# Patient Record
Sex: Male | Born: 1945
Health system: Southern US, Community
[De-identification: ages and names within clinical notes are randomized; demographics above are authoritative.]

## PROBLEM LIST (undated history)

## (undated) DIAGNOSIS — F329 Major depressive disorder, single episode, unspecified: Secondary | ICD-10-CM

## (undated) DIAGNOSIS — I348 Other nonrheumatic mitral valve disorders: Secondary | ICD-10-CM

## (undated) DIAGNOSIS — I4891 Unspecified atrial fibrillation: Secondary | ICD-10-CM

## (undated) DIAGNOSIS — E7800 Pure hypercholesterolemia, unspecified: Secondary | ICD-10-CM

## (undated) DIAGNOSIS — I340 Nonrheumatic mitral (valve) insufficiency: Secondary | ICD-10-CM

## (undated) DIAGNOSIS — I3489 Other nonrheumatic mitral valve disorders: Secondary | ICD-10-CM

## (undated) DIAGNOSIS — Z720 Tobacco use: Secondary | ICD-10-CM

## (undated) DIAGNOSIS — F32A Depression, unspecified: Secondary | ICD-10-CM

## (undated) HISTORY — DX: Tobacco use: Z72.0

## (undated) HISTORY — DX: Pure hypercholesterolemia, unspecified: E78.00

## (undated) HISTORY — DX: Other nonrheumatic mitral valve disorders: I34.8

## (undated) HISTORY — PX: TONSILLECTOMY: SUR1361

## (undated) HISTORY — DX: Nonrheumatic mitral (valve) insufficiency: I34.0

## (undated) HISTORY — DX: Major depressive disorder, single episode, unspecified: F32.9

## (undated) HISTORY — DX: Unspecified atrial fibrillation: I48.91

## (undated) HISTORY — PX: SHOULDER SURGERY: SHX246

## (undated) HISTORY — DX: Depression, unspecified: F32.A

## (undated) HISTORY — DX: Other nonrheumatic mitral valve disorders: I34.89

---

## 2010-03-12 ENCOUNTER — Encounter: Payer: Self-pay | Admitting: Family Medicine

## 2010-03-12 DIAGNOSIS — R002 Palpitations: Secondary | ICD-10-CM

## 2010-03-14 ENCOUNTER — Other Ambulatory Visit: Payer: Self-pay

## 2010-03-14 ENCOUNTER — Ambulatory Visit (HOSPITAL_COMMUNITY)
Admission: RE | Admit: 2010-03-14 | Discharge: 2010-03-14 | Disposition: A | Discharge status: Home / Self Care | Payer: Self-pay | Source: Ambulatory Visit | Attending: Physician Assistant | Admitting: Physician Assistant

## 2010-03-14 ENCOUNTER — Other Ambulatory Visit: Payer: Self-pay | Admitting: Family Medicine

## 2010-03-14 ENCOUNTER — Encounter: Payer: Self-pay | Admitting: Family Medicine

## 2010-03-14 DIAGNOSIS — I4821 Permanent atrial fibrillation: Secondary | ICD-10-CM | POA: Insufficient documentation

## 2010-03-14 DIAGNOSIS — R002 Palpitations: Secondary | ICD-10-CM

## 2010-03-14 DIAGNOSIS — I4510 Unspecified right bundle-branch block: Secondary | ICD-10-CM | POA: Insufficient documentation

## 2010-03-14 DIAGNOSIS — I498 Other specified cardiac arrhythmias: Secondary | ICD-10-CM | POA: Insufficient documentation

## 2010-03-14 DIAGNOSIS — I4891 Unspecified atrial fibrillation: Secondary | ICD-10-CM | POA: Insufficient documentation

## 2010-03-14 DIAGNOSIS — I480 Paroxysmal atrial fibrillation: Secondary | ICD-10-CM | POA: Insufficient documentation

## 2010-03-14 DIAGNOSIS — N429 Disorder of prostate, unspecified: Secondary | ICD-10-CM

## 2010-03-14 LAB — COMPREHENSIVE METABOLIC PANEL
AST: 16 U/L (ref 0–37)
Albumin: 4.5 g/dL (ref 3.5–5.2)
Alkaline Phosphatase: 57 U/L (ref 39–117)
BUN: 16 mg/dL (ref 6–23)
Creat: 0.91 mg/dL (ref 0.40–1.50)
Glucose, Bld: 85 mg/dL (ref 70–99)
Potassium: 4.3 mEq/L (ref 3.5–5.3)
Total Bilirubin: 0.7 mg/dL (ref 0.3–1.2)

## 2010-03-14 LAB — CONVERTED CEMR LAB
Albumin: 4.5 g/dL (ref 3.5–5.2)
BUN: 16 mg/dL (ref 6–23)
CO2: 31 meq/L (ref 19–32)
Glucose, Bld: 85 mg/dL (ref 70–99)
Hemoglobin: 16 g/dL (ref 13.0–17.0)
MCV: 95.2 fL (ref 78.0–100.0)
RBC: 4.84 M/uL (ref 4.22–5.81)
Sodium: 141 meq/L (ref 135–145)
TSH: 4.555 microintl units/mL — ABNORMAL HIGH (ref 0.350–4.500)
Total Bilirubin: 0.7 mg/dL (ref 0.3–1.2)
Total Protein: 6.7 g/dL (ref 6.0–8.3)
WBC: 6.6 10*3/uL (ref 4.0–10.5)

## 2010-03-14 NOTE — Assessment & Plan Note (Signed)
Possible atrial fibrilation and has heart murmur.  Will start on beta blocker and aspirin.  Return to clinic for TSH, EKG and echocardiogram.  Finances are an issue so he will apply for aid through [facility]

## 2010-03-14 NOTE — Progress Notes (Signed)
Dr. Leveda Anna notified of BP reading today.

## 2010-03-14 NOTE — Progress Notes (Signed)
  Subjective:    Patient ID: Kenneth Mclaughlin, male    DOB: [birth year], 65 y.o.   MRN: [id number]  HPI Seen after hours in [facility].  Patient felt palpatations.  Son, who is MD in residency, felt rapid irregular heart beat and suspected atrial fibrilation.  Has a history of intermitant palpitations for years.    Review of Systems No syncope, DOE, stroke or chest pain.  No history of CHF, thyroid problems, or CAD     Objective:   Physical Exam Heart RRR, 2/6 systolic murmur Lungs clear Neck no thyromegally       Assessment & Plan:

## 2010-03-15 LAB — CBC
HCT: 46.1 % (ref 39.0–52.0)
Hemoglobin: 16 g/dL (ref 13.0–17.0)
MCH: 33.1 pg (ref 26.0–34.0)
MCHC: 34.7 g/dL (ref 30.0–36.0)
MCV: 95.2 fL (ref 78.0–100.0)
RDW: 13.3 % (ref 11.5–15.5)

## 2010-03-28 ENCOUNTER — Telehealth: Payer: Self-pay | Admitting: Family Medicine

## 2010-03-28 DIAGNOSIS — R002 Palpitations: Secondary | ICD-10-CM

## 2010-03-28 NOTE — Telephone Encounter (Signed)
Called and informed pt of echo appt. Pt agreed.Loralee Pacas Hepburn

## 2010-03-28 NOTE — Telephone Encounter (Signed)
Mr. Salce has been approved to receive the [facility] card.  Have yet to actually get one, but she has certified his eligibility.  Please go forth with referral to Cardiologist and call when appt has been made.

## 2010-04-05 ENCOUNTER — Other Ambulatory Visit: Payer: Self-pay | Admitting: Family Medicine

## 2010-04-05 ENCOUNTER — Ambulatory Visit (HOSPITAL_COMMUNITY)
Admission: RE | Admit: 2010-04-05 | Discharge: 2010-04-05 | Disposition: A | Discharge status: Home / Self Care | Payer: Self-pay | Source: Ambulatory Visit | Attending: Family Medicine | Admitting: Family Medicine

## 2010-04-05 DIAGNOSIS — I079 Rheumatic tricuspid valve disease, unspecified: Secondary | ICD-10-CM | POA: Insufficient documentation

## 2010-04-05 DIAGNOSIS — Z87891 Personal history of nicotine dependence: Secondary | ICD-10-CM | POA: Insufficient documentation

## 2010-04-05 DIAGNOSIS — I519 Heart disease, unspecified: Secondary | ICD-10-CM | POA: Insufficient documentation

## 2010-04-05 DIAGNOSIS — I059 Rheumatic mitral valve disease, unspecified: Secondary | ICD-10-CM | POA: Insufficient documentation

## 2010-04-06 ENCOUNTER — Encounter: Payer: Self-pay | Admitting: Family Medicine

## 2010-04-08 ENCOUNTER — Ambulatory Visit (INDEPENDENT_AMBULATORY_CARE_PROVIDER_SITE_OTHER): Payer: Self-pay | Admitting: Family Medicine

## 2010-04-08 ENCOUNTER — Encounter: Payer: Self-pay | Admitting: Family Medicine

## 2010-04-08 DIAGNOSIS — Z2989 Encounter for other specified prophylactic measures: Secondary | ICD-10-CM | POA: Insufficient documentation

## 2010-04-08 DIAGNOSIS — I059 Rheumatic mitral valve disease, unspecified: Secondary | ICD-10-CM

## 2010-04-08 DIAGNOSIS — I38 Endocarditis, valve unspecified: Secondary | ICD-10-CM

## 2010-04-08 DIAGNOSIS — I340 Nonrheumatic mitral (valve) insufficiency: Secondary | ICD-10-CM | POA: Insufficient documentation

## 2010-04-08 DIAGNOSIS — IMO0002 Reserved for concepts with insufficient information to code with codable children: Secondary | ICD-10-CM

## 2010-04-08 DIAGNOSIS — Z298 Encounter for other specified prophylactic measures: Secondary | ICD-10-CM | POA: Insufficient documentation

## 2010-04-08 MED ORDER — METOPROLOL TARTRATE 25 MG PO TABS: 25.0000 mg | ORAL_TABLET | Freq: Two times a day (BID) | ORAL | Status: DC

## 2010-04-08 NOTE — Patient Instructions (Signed)
You should have antibiotics prior to any dental procedure. See cardiologist for further recommendations. Stay on aspirin and metoprolol. When convenient, please see me to get up to date on disease prevention and screening exams.

## 2010-04-08 NOTE — Progress Notes (Signed)
  Subjective:    Patient ID: Kenneth Mclaughlin, male    DOB: Mar 15, 1945, 65 y.o.   MRN: [id number]  HPI Seen earlier for presumed A fib and found to have systolic murmur.  Echo obtained and shows Myxomatous degeneration, prolapse and mitral regurg.  He is asymptomatic with no CP or SOB.    Review of Systems  Denies leg swelling      Objective:   Physical Exam No JVD Lungs Clear Cardiac 2/6 SEM       Assessment & Plan:

## 2010-04-08 NOTE — Assessment & Plan Note (Signed)
Will refer to cardiology 

## 2010-05-10 ENCOUNTER — Encounter: Payer: Self-pay | Admitting: Cardiovascular Disease

## 2010-05-11 ENCOUNTER — Encounter: Payer: Self-pay | Admitting: Cardiovascular Disease

## 2010-05-11 ENCOUNTER — Encounter: Payer: Self-pay | Admitting: Cardiology

## 2010-05-11 ENCOUNTER — Ambulatory Visit (INDEPENDENT_AMBULATORY_CARE_PROVIDER_SITE_OTHER): Payer: Self-pay | Admitting: Cardiovascular Disease

## 2010-05-11 VITALS — BP 108/70 | HR 69 | Resp 18 | Ht 74.0 in | Wt 213.8 lb

## 2010-05-11 DIAGNOSIS — I059 Rheumatic mitral valve disease, unspecified: Secondary | ICD-10-CM

## 2010-05-11 DIAGNOSIS — I493 Ventricular premature depolarization: Secondary | ICD-10-CM

## 2010-05-11 DIAGNOSIS — I340 Nonrheumatic mitral (valve) insufficiency: Secondary | ICD-10-CM

## 2010-05-11 DIAGNOSIS — I4949 Other premature depolarization: Secondary | ICD-10-CM

## 2010-05-11 MED ORDER — HYDRALAZINE HCL 25 MG PO TABS: 25.0000 mg | ORAL_TABLET | Freq: Three times a day (TID) | ORAL | Status: DC

## 2010-05-11 NOTE — Assessment & Plan Note (Signed)
Aysmptomatic. Continue beta blocker.

## 2010-05-11 NOTE — Assessment & Plan Note (Addendum)
Severe eccentric MR. Possible flail anterior leaflet. His LA is not significantly enlarged but his LV is dilated. End diastolic diameter of 65mm and end systolic diameter of 41mm. His LVEF is 50%. Based on these findings, I am concerned that MV replacement may be warranted soon. He is mildly symptomatic with fatigue but no dyspnea. He does have frequent PVCs but no documented atrial fibrillation. I will arrange a TEE with Dr. Shirlee Latch on Friday April 27th at [facility]. No contradindications to procedure. I will get BMET, CBC and coags today. Pt willing to proceed with the test. Risks and benefits reviewed with pt.   I will start Hydralazine 10 mg po TID.

## 2010-05-11 NOTE — Progress Notes (Signed)
History of Present Illness:64 yo WM with no prior past medical history who is referred today for evaluation of an abnormal echo. He tells me that he began having palpitations in November. He has has been under much stress at work and at home. He recently lost his job and [employer]. He does have a history of anxiety. He was seen by Dr. Leveda Anna and an echo was ordered. (His son is Dr. Renold Don in the [facility] Medicine Department). He denies chest pain or SOB. He does endorse a dry cough for several months. No dizziness, near syncope, syncope or LE edema. He does have fatigue and is easily worn out during the day. He had a stress test 5-7 years ago that was normal. He was started on metoprolol a few weeks ago and the palpitations are less apparent. His echo showed severe MR, dilated LV.   Past Medical History  Diagnosis Date  . Mitral regurgitation   . Myxomatous degeneration of mitral valve     Past Surgical History  Procedure Date  . Tonsillectomy   . Shoulder surgery     Current Outpatient Prescriptions  Medication Sig Dispense Refill  . aspirin 325 MG tablet Take 325 mg by mouth daily.        . diazepam (VALIUM) 5 MG tablet Take 5 mg by mouth as needed.        . Glucosamine-Chondroit-Vit C-Mn (GLUCOSAMINE 1500 COMPLEX PO) Take 1,500 mg by mouth daily.        . metoprolol tartrate (LOPRESSOR) 25 MG tablet Take 1 tablet (25 mg total) by mouth 2 (two) times daily.  180 tablet  3  . Multiple Vitamin (MULTIVITAMIN) tablet Take 1 tablet by mouth daily.        Marland Kitchen DISCONTD: aspirin 81 MG tablet Take 81 mg by mouth daily.         No Known Allergies  History   Social History  . Marital Status: Married    Spouse Name: N/A    Number of Children: N/A  . Years of Education: N/A   Occupational History  . Not on file.   Social History Main Topics  . Smoking status: Former Smoker    Quit date: 03/14/1977  . Smokeless tobacco: Never Used  . Alcohol Use: 1.5 oz/week    3 drink(s) per  week  . Drug Use: Not on file  . Sexually Active: Not on file   Other Topics Concern  . Not on file   Social History Narrative  . No narrative on file    Family History  Problem Relation Age of Onset  . Heart failure Mother   . Heart disease Brother   . Heart disease Brother     Review of Systems:  As stated in the HPI and otherwise negative.   BP 108/70  Pulse 69  Resp 18  Ht 6\' 2"  (1.88 m)  Wt 213 lb 12.8 oz (96.979 kg)  BMI 27.45 kg/m2  Physical Examination: General: Well developed, well nourished, NAD HEENT: OP clear, mucus membranes moist SKIN: warm, dry. No rashes. Neuro: No focal deficits Musculoskeletal: Muscle strength 5/5 all ext Psychiatric: Mood and affect normal Neck: No JVD, no carotid bruits, no thyromegaly, no lymphadenopathy. Lungs:Clear bilaterally, no wheezes, rhonci, crackles Cardiovascular: RRR, loud systolic murmur. No gallops or rubs. Abdomen:Soft. Bowel sounds present. Non-tender.  Extremities: No lower extremity edema. Pulses are 2 + in the bilateral DP/PT.  EKG: Sinus rhythm. Frequent PVCs. Rate 69 bpm.   Echo 04/05/10:  Left ventricle: The cavity size was moderately dilated. Wall     thickness was normal. Systolic function was normal. The estimated     ejection fraction was in the range of 50% to 55%. Wall motion was     normal; there were no regional wall motion abnormalities. There     was a reduced contribution of atrial contraction to ventricular     filling, due to increased ventricular diastolic pressure or atrial     contractile dysfunction. Doppler parameters are consistent with a     reversible restrictive pattern, indicative of decreased left     ventricular diastolic compliance and/or increased left atrial     pressure (grade 3 diastolic dysfunction). Doppler parameters are     consistent with elevated mean left atrial filling pressure.   - Mitral valve: Moderately severe myxomatous degeneration of the     anterior mitral  leaflet with prolapse. Severe prolapse, involving     the anterior leaflet. Severe regurgitation.   - Left atrium: The atrium was mildly dilated.   - Atrial septum: No defect or patent foramen ovale was identified.   - Tricuspid valve: Mild regurgitation.

## 2010-05-11 NOTE — Patient Instructions (Addendum)
Your physician has requested that you have a TEE. During a TEE, sound waves are used to create images of your heart. It provides your doctor with information about the size and shape of your heart and how well your heart's chambers and valves are working. In this test, a transducer is attached to the end of a flexible tube that's guided down your throat and into your esophagus (the tube leading from you mouth to your stomach) to get a more detailed image of your heart. You are not awake for the procedure. Please see the instruction sheet given to you today. For further information please visit [email or web address]  Your physician has recommended you make the following change in your medication: START HYDRALAZINE 25 mg three times daily.  Your physician recommends that you schedule a follow-up appointment in: 2-3 weeks with Dr. Clifton James

## 2010-05-12 LAB — CBC WITH DIFFERENTIAL/PLATELET
Eosinophils Relative: 1.8 % (ref 0.0–5.0)
MCV: 96.7 fl (ref 78.0–100.0)
Monocytes Absolute: 0.8 10*3/uL (ref 0.1–1.0)
Monocytes Relative: 9.4 % (ref 3.0–12.0)
Neutrophils Relative %: 59.8 % (ref 43.0–77.0)
Platelets: 162 10*3/uL (ref 150.0–400.0)
WBC: 8.5 10*3/uL (ref 4.5–10.5)

## 2010-05-12 LAB — BASIC METABOLIC PANEL
GFR: 87.31 mL/min (ref >60.00)
Glucose, Bld: 94 mg/dL (ref 70–99)
Potassium: 4.2 mEq/L (ref 3.5–5.1)
Sodium: 139 mEq/L (ref 135–145)

## 2010-05-12 LAB — APTT: aPTT: 28.8 s (ref 21.7–28.8)

## 2010-05-12 LAB — PROTIME-INR: INR: 1 ratio (ref 0.8–1.0)

## 2010-05-13 ENCOUNTER — Ambulatory Visit (HOSPITAL_COMMUNITY)
Admission: RE | Admit: 2010-05-13 | Discharge: 2010-05-13 | Disposition: A | Discharge status: Home / Self Care | Payer: Self-pay | Source: Ambulatory Visit | Attending: Cardiology | Admitting: Cardiology

## 2010-05-13 DIAGNOSIS — I059 Rheumatic mitral valve disease, unspecified: Secondary | ICD-10-CM

## 2010-05-16 ENCOUNTER — Telehealth: Payer: Self-pay | Admitting: Cardiovascular Disease

## 2010-05-16 NOTE — Telephone Encounter (Signed)
Pt had tee done and then has fu appt with mcalhany 5-17, pt "KNOWS" what dr Clifton James will say, that he needs a heart cath, so can he just skip the appt and just schedule the cath?

## 2010-05-16 NOTE — Telephone Encounter (Signed)
LVMTCB

## 2010-05-16 NOTE — Telephone Encounter (Signed)
Pt rtn call pls call back [phone]

## 2010-05-16 NOTE — Telephone Encounter (Signed)
Patient had TEE done last Friday with Dr. Shirlee Latch. He said that Dr. Clifton James had discussed possibly doing a cardiac cath after getting the results of his TEE in preparation for surgery on his mitral valve. He is wondering if he still needs to come into the office to discuss this or if he wanted to proceed with the cath, setting this up over the phone. Will forward to Dr. Clifton James to review.

## 2010-05-17 NOTE — Telephone Encounter (Signed)
We can go ahead and set up the right and left heart cath. Will need to be in main lab. I am in the lab on 05/23/10. cdm

## 2010-05-18 ENCOUNTER — Encounter: Payer: Self-pay | Admitting: Cardiology

## 2010-05-18 NOTE — Telephone Encounter (Signed)
Whitney, He can stay on 25 mg po TID of hydralazine if that is what he has been on. I have no idea how to respond to this type of note in Epic so I created an addendum. Thanks, chris

## 2010-05-18 NOTE — Telephone Encounter (Signed)
Cath scheduled for 05/23/10 @ 11:30 with Dr. Clifton James. He will get his pre cath blood work tomorrow.

## 2010-05-19 ENCOUNTER — Other Ambulatory Visit (INDEPENDENT_AMBULATORY_CARE_PROVIDER_SITE_OTHER): Payer: Self-pay | Admitting: *Deleted

## 2010-05-19 DIAGNOSIS — Z01810 Encounter for preprocedural cardiovascular examination: Secondary | ICD-10-CM

## 2010-05-19 LAB — CBC WITH DIFFERENTIAL/PLATELET
Basophils Relative: 0.5 % (ref 0.0–3.0)
Eosinophils Absolute: 0.1 10*3/uL (ref 0.0–0.7)
Eosinophils Relative: 1.2 % (ref 0.0–5.0)
HCT: 42.6 % (ref 39.0–52.0)
Hemoglobin: 14.7 g/dL (ref 13.0–17.0)
Lymphs Abs: 1.8 10*3/uL (ref 0.7–4.0)
MCHC: 34.5 g/dL (ref 30.0–36.0)
MCV: 97.6 fl (ref 78.0–100.0)
Monocytes Absolute: 0.7 10*3/uL (ref 0.1–1.0)
Neutro Abs: 4.1 10*3/uL (ref 1.4–7.7)
RBC: 4.37 Mil/uL (ref 4.22–5.81)
WBC: 6.7 10*3/uL (ref 4.5–10.5)

## 2010-05-19 LAB — BASIC METABOLIC PANEL
CO2: 27 mEq/L (ref 19–32)
Chloride: 103 mEq/L (ref 96–112)
Potassium: 4.3 mEq/L (ref 3.5–5.1)

## 2010-05-19 LAB — PROTIME-INR: INR: 1 ratio (ref 0.8–1.0)

## 2010-05-23 ENCOUNTER — Inpatient Hospital Stay: Admit: 2010-05-23 | Payer: Self-pay

## 2010-05-23 ENCOUNTER — Ambulatory Visit (HOSPITAL_COMMUNITY)
Admission: RE | Admit: 2010-05-23 | Discharge: 2010-05-23 | Disposition: A | Discharge status: Home / Self Care | Payer: Self-pay | Source: Ambulatory Visit | Attending: Cardiovascular Disease | Admitting: Cardiovascular Disease

## 2010-05-23 DIAGNOSIS — I2510 Atherosclerotic heart disease of native coronary artery without angina pectoris: Secondary | ICD-10-CM | POA: Insufficient documentation

## 2010-05-23 DIAGNOSIS — I059 Rheumatic mitral valve disease, unspecified: Secondary | ICD-10-CM | POA: Insufficient documentation

## 2010-05-23 DIAGNOSIS — I739 Peripheral vascular disease, unspecified: Secondary | ICD-10-CM

## 2010-05-23 DIAGNOSIS — I519 Heart disease, unspecified: Secondary | ICD-10-CM | POA: Insufficient documentation

## 2010-05-23 LAB — POCT I-STAT 3, ART BLOOD GAS (G3+)
Acid-base deficit: 1 mmol/L (ref 0.0–2.0)
O2 Saturation: 92 %
TCO2: 25 mmol/L (ref 0–100)
pCO2 arterial: 42.5 mmHg (ref 35.0–45.0)

## 2010-05-23 LAB — POCT I-STAT 3, VENOUS BLOOD GAS (G3P V)
Acid-base deficit: 3 mmol/L — ABNORMAL HIGH (ref 0.0–2.0)
Bicarbonate: 23.3 mEq/L (ref 20.0–24.0)
O2 Saturation: 68 %
TCO2: 25 mmol/L (ref 0–100)
pO2, Ven: 39 mmHg (ref 30.0–45.0)

## 2010-05-24 ENCOUNTER — Telehealth: Payer: Self-pay | Admitting: Cardiology

## 2010-05-24 DIAGNOSIS — I340 Nonrheumatic mitral (valve) insufficiency: Secondary | ICD-10-CM

## 2010-05-24 NOTE — Telephone Encounter (Signed)
Called patient to schedule appointment to see Dr. Cornelius Moras on Monday 05/30/10 with cardiothoracic surgery to discuss mitral valve replacement

## 2010-05-25 NOTE — Telephone Encounter (Signed)
Scheduled for 05/30/10 @ 11:15 am to see Dr. Cornelius Moras. Patient is aware.

## 2010-05-29 NOTE — Cardiovascular Report (Signed)
NAME:  Kenneth Mclaughlin, Kenneth Mclaughlin NO.:  [id number]  MEDICAL RECORD NO.:  [id number]           PATIENT TYPE:  O  LOCATION:  [facility]                         FACILITY:  [facility]  PHYSICIAN:  Verne Carrow, MDDATE OF BIRTH:  01-05-1946  DATE OF PROCEDURE:  05/23/2010 DATE OF DISCHARGE:  05/23/2010                           CARDIAC CATHETERIZATION   PRIMARY CARE PHYSICIAN:  Chrissie Noa A. Hensel, MD  PROCEDURE PERFORMED: 1. Left heart catheterization. 2. Right heart catheterization. 3. Selective coronary angiography. 4. Left ventricular angiogram. 5. Distal aortogram.  OPERATOR:  Verne Carrow, MD  INDICATIONS:  This is a 65 year old Caucasian male with recently diagnosed severe mitral valve regurgitation.  The patient underwent a transesophageal echocardiogram that showed severe mitral regurgitation. Diagnostic catheterization was arranged for today to exclude coronary artery disease and to assess his filling pressures.  DETAILS OF PROCEDURE:  The patient was brought to the main cardiac catheterization laboratory after signing informed consent for the procedure.  The left groin was prepped and draped in sterile fashion. Lidocaine 1% was used for local anesthesia.  A 5-French sheath was inserted into the left femoral artery without difficulty.  A 7-French sheath was inserted into the left femoral vein without difficulty.  A balloon-tipped catheter was used to perform a right heart catheterization.  Standard diagnostic catheters were used to perform selective coronary angiography.  A pigtail catheter was used to perform a left ventricular angiogram and a distal aortogram.  The patient tolerated the procedure well.  He was taken to the recovery area in stable condition.  HEMODYNAMIC FINDINGS:  Central aortic pressure 90/53.  Left ventricular pressure 99/14/20.  Right atrial pressure 9.  Right ventricular pressure 34/13/15.  Pulmonary artery pressure 36/21 with a  mean of 29.  Pulmonary capillary wedge pressure mean of 25.  Pulmonary artery saturation 68%. Aortic saturation 92%.  Cardiac output 6.15 L per minute.  Cardiac index 2.77 L per minute per meter squared.  ANGIOGRAPHIC FINDINGS: 1. The left main coronary artery bifurcated into the circumflex and     left anterior descending artery.  There was no disease in this     vessel. 2. The left anterior descending was a large vessel that coursed to the     apex.  There was mild plaque disease just after the takeoff of a     large-caliber diagonal branch.  There were no flow-limiting lesions     in this vessel. 3. Circumflex artery was a large vessel that gave rise to an early     obtuse marginal branch and two moderate-sized obtuse marginal     branches after that.  There was mild plaque disease but no     obstructive lesions in this vessel. 4. The right coronary artery is a small to moderate sized codominant     vessel with no obstructive lesions. 5. Left ventricular angiogram was performed in the RAO projection and     showed mitral regurgitation with global hypokinesis with ejection     fraction of 50%.  IMPRESSION: 1. Severe mitral regurgitation. 2. Mild nonobstructive coronary artery disease. 3. Mild global left ventricular systolic dysfunction.  RECOMMENDATIONS:  At this time, there is evidence of left ventricular dilatation with left ventricular dysfunction and severe insufficiency of the mitral valve.  Based on his data, I think that it is most appropriate to refer the patient to Cardiothoracic Surgery for a mitral valve repair or mitral valve replacement.  We will make a referral today and have followup in our office following the surgical consultation.  We will continue hydralazine for afterload reduction.  The patient will be discharged to home today after his bedrest.     Verne Carrow, MD     CM/MEDQ  D:  05/23/2010  T:  05/24/2010  Job:  [id number]  Electronically  Signed by Verne Carrow MD on 05/29/2010 10:15:26 PM

## 2010-05-30 ENCOUNTER — Encounter (INDEPENDENT_AMBULATORY_CARE_PROVIDER_SITE_OTHER): Payer: Self-pay | Admitting: Thoracic Surgery (Cardiothoracic Vascular Surgery)

## 2010-05-30 ENCOUNTER — Telehealth: Payer: Self-pay | Admitting: Family Medicine

## 2010-05-30 DIAGNOSIS — I059 Rheumatic mitral valve disease, unspecified: Secondary | ICD-10-CM

## 2010-05-30 NOTE — Telephone Encounter (Signed)
Seeing one of our pts, needs EKG, last office visit & notes faxed to them at above number.

## 2010-05-30 NOTE — Telephone Encounter (Signed)
Faxed to dr. Orvan July office.Kenneth Mclaughlin

## 2010-05-31 ENCOUNTER — Encounter: Payer: Self-pay | Admitting: *Deleted

## 2010-05-31 NOTE — H&P (Signed)
HISTORY AND PHYSICAL EXAMINATION  May 30, 2010  Re:  Kenneth Mclaughlin            DOB:  01/18/1945  REASON FOR CONSULTATION:  Severe mitral regurgitation.  HISTORY OF PRESENT ILLNESS:  The patient is a 65 year old gentleman from [facility] with no previous cardiac history who was recently discovered to have severe mitral regurgitation.  The patient states that in retrospect, he has been having persistent dry cough for nearly a year and increasing palpitations beginning last November.  He also has recently developed a mild exertional shortness of breath.  Symptoms accelerated until 2 months ago when he mentioned his frequent palpitations to his son who happens to be a second year resident in the [facility] family medicine program.  The patient was noted to have a murmur on physical exam and promptly evaluated by Dr. Doralee Albino.  A 2-D echocardiogram was performed demonstrating severe mitral regurgitation with moderate left ventricular chamber enlargement.  The patient also was apparently diagnosed with atrial fibrillation.  The patient was referred to Dr. Verne Carrow.  Transesophageal echocardiogram was performed on May 13, 2010, by Dr. Marca Ancona. This confirmed the presence of myxomatous mitral valve disease with severe mitral regurgitation.  This partially flail segment of the mitral valve with severe (4+) mitral regurgitation.  The left atrium was moderately enlarged.  The aortic valve was normal.  Left ventricular function was preserved with ejection fraction estimated 55%.  There is mild left ventricular chamber enlargement.  The patient then subsequently underwent left and right heart catheterization by Dr. Clifton James.  This confirmed the presence of normal coronary artery anatomy with no significant coronary artery disease.  Pulmonary artery pressures measured 36/21 with pulmonary capillary wedge pressure of 25.  For cardiac output was 6.15 L  per minute corresponding to the cardiac index of 2.77.  The patient also had imaging of his descending abdominal aorta and iliac vessels at catheterization.  This revealed no sign of significant aortoiliac occlusive disease.  The patient was referred for elective mitral valve repair.  REVIEW OF SYSTEMS:  GENERAL:  The patient reports normal appetite.  He has not been gaining nor losing weight recently.  He states that he did lose some weight just before Christmas, but he gained it back and overall he has been quite stable. VITAL SIGNS:  He is 6 feet 2 inches tall and weighs approximately 210 pounds. CARDIAC:  Notable for a 1-2 months history of mild exertional shortness of breath.  The patient states that he only notices this if he walks up a flight of stairs or do something strenuous.  He has been having frequent palpitations at least since this past November.  He carries with him a diagnosis of atrial fibrillation, although he is not sure if he has actually ever had the atrial fibrillation and he was noted to be in sinus rhythm when he was seen by Dr. Clifton James recently.  The patient denies PND, orthopnea, or lower extremity edema.  He has not had syncope.  He has not had any chest pain. RESPIRATORY:  Notable for a chronic dry cough.  This lasted for probably at least a year.  The patient also states that over the last couple of weeks he has had increased productive cough, coughing up some yellow thick sputum. GASTROINTESTINAL:  Negative.  The patient has no difficulty swallowing. He reports normal bowel function.  He denies hematochezia, hematemesis or melena. MUSCULOSKELETAL:  Negative.  The patient denies significant problems with  arthritis or arthralgias. GENITOURINARY:  Negative.  The patient denies symptoms suggestive of bladder outlet obstructions or problems with prostate dysfunction. NEUROLOGIC:  Negative.  The patient denies symptoms suggestive of previous TIA or  stroke. HEENT:  Negative.  The patient sees a dentist on a regular basis and gets routine dental cleanings. HEMATOLOGIC:  Negative.  The patient denies any particular bleeding, diathesis or easy bruising.  PAST MEDICAL HISTORY: 1. Mitral valve prolapse with mitral regurgitation, recently     diagnosed. 2. Atrial fibrillation (details unclear).  PAST SURGICAL HISTORY: 1. Right shoulder surgery. 2. Tonsillectomy  FAMILY HISTORY:  Noncontributory.  SOCIAL HISTORY:  The patient is married and lives with his wife and one of their adult children in [place].  The patient is recently unemployed having just recently been working in [employer]. Prior to that, he worked for years in [employer].  He lives reasonably active lifestyle without significant physical limitations. The patient does have a remote history of tobacco use, but he quit smoking in 1979.  The patient denies excessive alcohol consumption.  CURRENT MEDICATIONS: 1. Aspirin 81 mg daily. 2. Metoprolol 25 mg twice daily. 3. Hydralazine 25 mg 3 times daily. 4. Glucosamine complex 1 tablet daily. 5. Valium 5 mg as needed. 6. Multivitamin daily.  DRUG ALLERGIES:  None known.  PHYSICAL EXAMINATION:  General:  The patient is a well-appearing male who appears his stated age, in no acute distress.  Vital Signs:  Blood pressure 114/75, pulse 76 regular, oxygen saturation 94% on room air. HEENT:  Unrevealing.  Neck:  Supple.  There is no cervical nor supraclavicular lymphadenopathy.  There is no jugular venous distention. No carotid bruits are noted.  Auscultation of the chest demonstrates clear breath sounds which are symmetrical bilaterally.  No wheezes, rales or rhonchi are noted.  Cardiovascular:  Regular rate and rhythm. There is a prominent grade 3-4/6 holosystolic murmur heard best at the apex with radiation to the left sternal border.  No diastolic murmurs are noted.  Abdomen:  Soft, nondistended  and nontender.  Bowel sounds are present.  Extremities:  Warm and well-perfused.  There is no lower extremity edema.  Femoral pulses are palpable.  Distal pulses are palpable in the posterior tibial position bilaterally.  There is no sign of venous insufficiency.  Skin:  Clean, dry and healthy-appearing throughout.  Rectal and GU:  Both deferred.  Neurologic:  Grossly nonfocal and symmetrical throughout.  DIAGNOSTIC TESTS:  Transesophageal echocardiogram performed by Dr. Shirlee Latch on May 13, 2010, is reviewed.  This demonstrates myxomatous mitral valve disease with mild thickening of the leaflets and prolapse primarily of the anterior leaflet with a small flail segment and severe (4+) mitral regurgitation.  The area of flail segment appears to be close to the posterior commissure, presumably reflecting the A3 segment of the anterior leaflet.  The jet of regurgitation courses posteriorly around the atrium and somewhat eccentrically.  The left ventricular systolic function appears normal.  The aortic valve appears normal. There is mild tricuspid regurgitation.  The tricuspid annulus is not dilated.  Right ventricular size and function appears normal.  No other abnormalities are noted.  Left and right heart catheterization performed by Dr. Clifton James is reviewed.  Findings are as discussed previously.  There is normal coronary artery anatomy with mild atherosclerotic disease, but no significant flow-limiting lesions.  There is severe mitral regurgitation.  Right heart catheterization are as discussed previously. Imaging of the abdominal aorta and iliac vessels are notable for  the absence of significant aortoiliac occlusive disease.  IMPRESSION:  Mitral valve prolapse with severe mitral regurgitation. The patient has recent accelerated symptoms of palpitations and mild exertional shortness of breath.  He carries with him a diagnosis of atrial fibrillation, but it is unclear whether or not he  has actually ever had any sustained episodes of paroxysmal atrial fibrillation.  He has been in sinus rhythm here in the office today.  I agree that he would best be treated with elective mitral valve repair.  If he has had episodes of atrial fibrillation, we could also consider concomitant maze procedure.  The patient also has a persistent dry cough that has lasted for more than a year.  This may be related to his mitral regurgitation. However, in recent weeks he states that he has also had a productive cough.  He is not having associated fevers.  PLAN:  I have discussed matters at length with the patient and his wife. The rationale for surgical intervention has been discussed in detail.  I think he would be a good candidate for minimally invasive approach. This approach has been compared and contrasted with conventional sternotomy.  The risks and benefits of surgery have been reviewed in detail and all their questions have been answered.  They also understand a small chance that mitral valve replacement might be necessary if successful valve repair is not feasible.  Under these circumstances, we would recommend replacing his valve using a mechanical prosthesis because of his relatively young age and otherwise good life expectancy. Overall, I suspect there is a better than 90% likelihood that we should be able to repair his valve with excellent result.  All their questions have been addressed.  The patient seems eager to proceed with surgery in the near future.  We will plan to see him back in one week's time with a chest x-ray to make sure that his productive cough has resolved.  In the meanwhile, we will clarify whether or not he has actually had any episodes of atrial fibrillation.  We tentatively planned to proceed with surgery on Tuesday, May 29.  Kenneth Mclaughlin. Kenneth Mclaughlin, M.D. Electronically Signed  CHO/MEDQ  D:  05/30/2010  T:  05/31/2010  Job:  [id number]  cc:   Verne Carrow,  MD Santiago Bumpers. Leveda Anna, M.D. Marca Ancona, MD

## 2010-06-02 ENCOUNTER — Ambulatory Visit: Payer: Self-pay | Admitting: Cardiovascular Disease

## 2010-06-06 ENCOUNTER — Ambulatory Visit (INDEPENDENT_AMBULATORY_CARE_PROVIDER_SITE_OTHER): Payer: Self-pay | Admitting: Thoracic Surgery (Cardiothoracic Vascular Surgery)

## 2010-06-06 ENCOUNTER — Other Ambulatory Visit: Payer: Self-pay | Admitting: Cardiothoracic Surgery

## 2010-06-06 ENCOUNTER — Ambulatory Visit
Admission: RE | Admit: 2010-06-06 | Discharge: 2010-06-06 | Disposition: A | Discharge status: Home / Self Care | Payer: Self-pay | Source: Ambulatory Visit | Attending: Cardiothoracic Surgery | Admitting: Cardiothoracic Surgery

## 2010-06-06 DIAGNOSIS — I059 Rheumatic mitral valve disease, unspecified: Secondary | ICD-10-CM

## 2010-06-07 NOTE — Assessment & Plan Note (Signed)
OFFICE VISIT  Kenneth, Mclaughlin DOB:  01-Oct-1945                                        Jun 06, 2010 CHART #:  [id number]  The patient returns for further follow up with tentative plans to proceed with elective mitral valve repair on May 29.  He was originally seen in consultation on May 14 and a full consultation note and history and physical exam were dictated at that time.  Since then, he has done well.  In fact he reports that the cough he was complaining of last week has completely resolved.  We have also had the opportunity to review records from Dr. Gibson Ramp office.  It appears clear that the patient has never actually been documented to have any history of atrial fibrillation or other sustained arrhythmias.  Under the circumstances, it would not be appropriate to perform maze procedure.  We plan to proceed with mitral valve repair via mini thoracotomy approach next week.  We again reviewed the indications, risks, and potential benefits of surgery.  Surgical expectations have been discussed in detail and all of his questions have been answered.  I have given him a prescription for amiodarone 200 mg p.o. twice daily to begin now and take until the time of surgery to decrease the risk of perioperative arrhythmias.  Kenneth Mclaughlin, M.D. Electronically Signed  CHO/MEDQ  D:  06/06/2010  T:  06/07/2010  Job:  [id number]  cc:   Kenneth Carrow, MD Kenneth Mclaughlin. Kenneth Mclaughlin, M.D. Kenneth Ancona, MD

## 2010-06-09 ENCOUNTER — Ambulatory Visit (HOSPITAL_COMMUNITY)
Admission: RE | Admit: 2010-06-09 | Discharge: 2010-06-09 | Disposition: A | Discharge status: Home / Self Care | Payer: Self-pay | Source: Ambulatory Visit | Attending: Thoracic Surgery (Cardiothoracic Vascular Surgery) | Admitting: Thoracic Surgery (Cardiothoracic Vascular Surgery)

## 2010-06-09 ENCOUNTER — Other Ambulatory Visit: Payer: Self-pay | Admitting: Thoracic Surgery (Cardiothoracic Vascular Surgery)

## 2010-06-09 ENCOUNTER — Inpatient Hospital Stay (HOSPITAL_COMMUNITY)
Admission: RE | Admit: 2010-06-09 | Discharge: 2010-06-09 | Disposition: A | Discharge status: Home / Self Care | Payer: Self-pay | Source: Ambulatory Visit | Attending: Thoracic Surgery (Cardiothoracic Vascular Surgery) | Admitting: Thoracic Surgery (Cardiothoracic Vascular Surgery)

## 2010-06-09 ENCOUNTER — Encounter (HOSPITAL_COMMUNITY)
Admission: RE | Admit: 2010-06-09 | Discharge: 2010-06-09 | Disposition: A | Discharge status: Home / Self Care | Payer: Self-pay | Source: Ambulatory Visit | Attending: Thoracic Surgery (Cardiothoracic Vascular Surgery) | Admitting: Thoracic Surgery (Cardiothoracic Vascular Surgery)

## 2010-06-09 DIAGNOSIS — Z01811 Encounter for preprocedural respiratory examination: Secondary | ICD-10-CM | POA: Insufficient documentation

## 2010-06-09 DIAGNOSIS — I4510 Unspecified right bundle-branch block: Secondary | ICD-10-CM | POA: Insufficient documentation

## 2010-06-09 DIAGNOSIS — Z01812 Encounter for preprocedural laboratory examination: Secondary | ICD-10-CM | POA: Insufficient documentation

## 2010-06-09 DIAGNOSIS — I059 Rheumatic mitral valve disease, unspecified: Secondary | ICD-10-CM

## 2010-06-09 DIAGNOSIS — Z01810 Encounter for preprocedural cardiovascular examination: Secondary | ICD-10-CM

## 2010-06-09 DIAGNOSIS — Z01818 Encounter for other preprocedural examination: Secondary | ICD-10-CM | POA: Insufficient documentation

## 2010-06-09 LAB — HEMOGLOBIN A1C
Hgb A1c MFr Bld: 5.4 % (ref <5.7)
Mean Plasma Glucose: 108 mg/dL (ref <117)

## 2010-06-09 LAB — URINALYSIS, ROUTINE W REFLEX MICROSCOPIC
Bilirubin Urine: NEGATIVE
Glucose, UA: NEGATIVE mg/dL
Ketones, ur: NEGATIVE mg/dL
pH: 5.5 (ref 5.0–8.0)

## 2010-06-09 LAB — COMPREHENSIVE METABOLIC PANEL
ALT: 18 U/L (ref 0–53)
AST: 15 U/L (ref 0–37)
Calcium: 9.8 mg/dL (ref 8.4–10.5)
GFR calc Af Amer: >60 mL/min (ref >60)
Glucose, Bld: 97 mg/dL (ref 70–99)
Sodium: 139 mEq/L (ref 135–145)
Total Protein: 6.6 g/dL (ref 6.0–8.3)

## 2010-06-09 LAB — BLOOD GAS, ARTERIAL
Acid-base deficit: 0.8 mmol/L (ref 0.0–2.0)
Drawn by: 344381
Patient temperature: 98.6
TCO2: 23.1 mmol/L (ref 0–100)
pH, Arterial: 7.488 — ABNORMAL HIGH (ref 7.350–7.450)

## 2010-06-09 LAB — CBC
HCT: 42.2 % (ref 39.0–52.0)
Hemoglobin: 15.4 g/dL (ref 13.0–17.0)
MCH: 33.3 pg (ref 26.0–34.0)
MCHC: 36.5 g/dL — ABNORMAL HIGH (ref 30.0–36.0)

## 2010-06-09 LAB — SURGICAL PCR SCREEN: Staphylococcus aureus: POSITIVE — AB

## 2010-06-14 ENCOUNTER — Other Ambulatory Visit: Payer: Self-pay | Admitting: Thoracic Surgery (Cardiothoracic Vascular Surgery)

## 2010-06-14 ENCOUNTER — Inpatient Hospital Stay (HOSPITAL_COMMUNITY): Payer: Self-pay

## 2010-06-14 ENCOUNTER — Inpatient Hospital Stay (HOSPITAL_COMMUNITY)
Admission: RE | Admit: 2010-06-14 | Discharge: 2010-06-18 | DRG: 221 | Disposition: A | Discharge status: Home / Self Care | Payer: Self-pay | Source: Ambulatory Visit | Attending: Thoracic Surgery (Cardiothoracic Vascular Surgery) | Admitting: Thoracic Surgery (Cardiothoracic Vascular Surgery)

## 2010-06-14 DIAGNOSIS — Z87891 Personal history of nicotine dependence: Secondary | ICD-10-CM

## 2010-06-14 DIAGNOSIS — I059 Rheumatic mitral valve disease, unspecified: Secondary | ICD-10-CM

## 2010-06-14 DIAGNOSIS — D696 Thrombocytopenia, unspecified: Secondary | ICD-10-CM | POA: Diagnosis not present

## 2010-06-14 DIAGNOSIS — I10 Essential (primary) hypertension: Secondary | ICD-10-CM | POA: Diagnosis present

## 2010-06-14 DIAGNOSIS — Z7982 Long term (current) use of aspirin: Secondary | ICD-10-CM

## 2010-06-14 DIAGNOSIS — Z7901 Long term (current) use of anticoagulants: Secondary | ICD-10-CM

## 2010-06-14 HISTORY — PX: MITRAL VALVE REPAIR: SHX2039

## 2010-06-14 LAB — POCT I-STAT 3, ART BLOOD GAS (G3+)
Bicarbonate: 22.4 mEq/L (ref 20.0–24.0)
O2 Saturation: 96 %
TCO2: 24 mmol/L (ref 0–100)
pCO2 arterial: 36.2 mmHg (ref 35.0–45.0)
pCO2 arterial: 30.2 mmHg — ABNORMAL LOW (ref 35.0–45.0)
pH, Arterial: 7.486 — ABNORMAL HIGH (ref 7.350–7.450)
pO2, Arterial: 72 mmHg — ABNORMAL LOW (ref 80.0–100.0)

## 2010-06-14 LAB — CBC
HCT: 37.7 % — ABNORMAL LOW (ref 39.0–52.0)
Hemoglobin: 14.3 g/dL (ref 13.0–17.0)
Hemoglobin: 13.5 g/dL (ref 13.0–17.0)
MCH: 32.8 pg (ref 26.0–34.0)
RBC: 4.36 MIL/uL (ref 4.22–5.81)
WBC: 19.1 10*3/uL — ABNORMAL HIGH (ref 4.0–10.5)

## 2010-06-14 LAB — PROTIME-INR: Prothrombin Time: 18.4 seconds — ABNORMAL HIGH (ref 11.6–15.2)

## 2010-06-14 LAB — CREATININE, SERUM
Creatinine, Ser: 0.75 mg/dL (ref 0.4–1.5)
GFR calc non Af Amer: >60 mL/min (ref >60)

## 2010-06-14 LAB — MAGNESIUM: Magnesium: 2.5 mg/dL (ref 1.5–2.5)

## 2010-06-14 LAB — POCT I-STAT, CHEM 8
Chloride: 106 mEq/L (ref 96–112)
Glucose, Bld: 117 mg/dL — ABNORMAL HIGH (ref 70–99)
HCT: 36 % — ABNORMAL LOW (ref 39.0–52.0)
Potassium: 4.6 mEq/L (ref 3.5–5.1)

## 2010-06-14 LAB — POCT I-STAT 4, (NA,K, GLUC, HGB,HCT): HCT: 38 % — ABNORMAL LOW (ref 39.0–52.0)

## 2010-06-14 LAB — PLATELET COUNT: Platelets: 98 10*3/uL — ABNORMAL LOW (ref 150–400)

## 2010-06-15 ENCOUNTER — Inpatient Hospital Stay (HOSPITAL_COMMUNITY): Payer: Self-pay

## 2010-06-15 LAB — CREATININE, SERUM
Creatinine, Ser: 0.83 mg/dL (ref 0.4–1.5)
GFR calc Af Amer: >60 mL/min (ref >60)
GFR calc non Af Amer: >60 mL/min (ref >60)

## 2010-06-15 LAB — POCT I-STAT, CHEM 8
Creatinine, Ser: 1 mg/dL (ref 0.4–1.5)
HCT: 31 % — ABNORMAL LOW (ref 39.0–52.0)
Hemoglobin: 10.5 g/dL — ABNORMAL LOW (ref 13.0–17.0)
Potassium: 3.9 mEq/L (ref 3.5–5.1)
Sodium: 137 mEq/L (ref 135–145)

## 2010-06-15 LAB — GLUCOSE, CAPILLARY
Glucose-Capillary: 151 mg/dL — ABNORMAL HIGH (ref 70–99)
Glucose-Capillary: 134 mg/dL — ABNORMAL HIGH (ref 70–99)
Glucose-Capillary: 107 mg/dL — ABNORMAL HIGH (ref 70–99)
Glucose-Capillary: 142 mg/dL — ABNORMAL HIGH (ref 70–99)
Glucose-Capillary: 130 mg/dL — ABNORMAL HIGH (ref 70–99)

## 2010-06-15 LAB — CBC
Hemoglobin: 11.4 g/dL — ABNORMAL LOW (ref 13.0–17.0)
MCH: 32.6 pg (ref 26.0–34.0)
MCV: 91.9 fL (ref 78.0–100.0)
Platelets: 88 10*3/uL — ABNORMAL LOW (ref 150–400)
RBC: 3.46 MIL/uL — ABNORMAL LOW (ref 4.22–5.81)
RDW: 13.1 % (ref 11.5–15.5)
WBC: 14.9 10*3/uL — ABNORMAL HIGH (ref 4.0–10.5)

## 2010-06-15 LAB — PREPARE PLATELET PHERESIS: Unit division: 0

## 2010-06-15 LAB — BASIC METABOLIC PANEL
BUN: 12 mg/dL (ref 6–23)
Chloride: 107 mEq/L (ref 96–112)
Creatinine, Ser: 0.76 mg/dL (ref 0.4–1.5)
GFR calc non Af Amer: >60 mL/min (ref >60)
Glucose, Bld: 115 mg/dL — ABNORMAL HIGH (ref 70–99)

## 2010-06-15 LAB — MAGNESIUM
Magnesium: 2.3 mg/dL (ref 1.5–2.5)
Magnesium: 2.2 mg/dL (ref 1.5–2.5)

## 2010-06-15 LAB — POCT I-STAT 4, (NA,K, GLUC, HGB,HCT)
Glucose, Bld: 75 mg/dL (ref 70–99)
Glucose, Bld: 137 mg/dL — ABNORMAL HIGH (ref 70–99)
HCT: 28 % — ABNORMAL LOW (ref 39.0–52.0)
HCT: 23 % — ABNORMAL LOW (ref 39.0–52.0)
Hemoglobin: 11.9 g/dL — ABNORMAL LOW (ref 13.0–17.0)
Hemoglobin: 11.9 g/dL — ABNORMAL LOW (ref 13.0–17.0)
Hemoglobin: 8.2 g/dL — ABNORMAL LOW (ref 13.0–17.0)
Potassium: 3.5 mEq/L (ref 3.5–5.1)
Potassium: 3.8 mEq/L (ref 3.5–5.1)
Potassium: 4.5 mEq/L (ref 3.5–5.1)
Sodium: 139 mEq/L (ref 135–145)
Sodium: 135 mEq/L (ref 135–145)
Sodium: 137 mEq/L (ref 135–145)

## 2010-06-15 LAB — POCT I-STAT 3, ART BLOOD GAS (G3+)
Acid-Base Excess: 1 mmol/L (ref 0.0–2.0)
Acid-base deficit: 4 mmol/L — ABNORMAL HIGH (ref 0.0–2.0)
O2 Saturation: 100 %
O2 Saturation: 86 %
O2 Saturation: 93 %
pCO2 arterial: 46.3 mmHg — ABNORMAL HIGH (ref 35.0–45.0)
pO2, Arterial: 58 mmHg — ABNORMAL LOW (ref 80.0–100.0)

## 2010-06-15 LAB — POCT I-STAT GLUCOSE
Glucose, Bld: 145 mg/dL — ABNORMAL HIGH (ref 70–99)
Operator id: 3406

## 2010-06-15 LAB — PREPARE FRESH FROZEN PLASMA: Unit division: 0

## 2010-06-15 NOTE — Op Note (Signed)
NAME:  Kenneth Mclaughlin, Kenneth Mclaughlin NO.:  [id number]  MEDICAL RECORD NO.:  [id number]           PATIENT TYPE:  I  LOCATION:  2315                         FACILITY:  [facility]  PHYSICIAN:  Salvatore Decent. Cornelius Moras, M.D. DATE OF BIRTH:  14-Mar-1945  DATE OF PROCEDURE:  06/14/2010 DATE OF DISCHARGE:                              OPERATIVE REPORT   PREOPERATIVE DIAGNOSIS:  Severe mitral regurgitation.  POSTOPERATIVE DIAGNOSIS:  Severe mitral regurgitation.  PROCEDURE:  Right miniature thoracotomy for mitral valve repair (complex valvuloplasty including transposition of native chordae tendineae x2, artificial Gore-Tex chord replacement x6, and 30-mm Sorin Memo 3D ring annuloplasty).  SURGEON:  Salvatore Decent. Cornelius Moras, MD  ASSISTANT:  Rowe Clack, PA-C  ANESTHESIA:  General.  BRIEF CLINICAL NOTE:  The patient is a 65 year old male who is otherwise healthy who recently developed symptoms of exertional shortness of breath.  He was noted to have a heart murmur on physical exam.  2D echocardiogram revealed severe mitral regurgitation.  Transesophageal echocardiogram and left and right heart catheterization were performed confirming the presence of mitral valve prolapse with flail anterior leaflet and severe (4+) mitral regurgitation.  There is normal coronary artery anatomy with no significant coronary artery disease.  A full consultation note has been dictated previously.  The patient and his wife have been counseled regarding the indications, risks, and potential benefits of surgery.  Alternative treatment strategies have been discussed.  They understand and accept all potential associated risks of surgery and desire to proceed as described.  OPERATIVE FINDINGS: 1. 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 2. Type 2 valve dysfunction with severe (4+) mitral regurgitation. 3. Normal left ventricular systolic function. 4. No residual mitral  regurgitation following successful mitral valve     repair.  OPERATIVE PROCEDURE IN DETAIL:  The patient was brought to the operating room on the above-mentioned date and central monitoring was established by the anesthesia team.  Specifically, a Swan-Ganz catheter was placed through the left internal jugular approach.  A radial arterial line was placed.  Intravenous antibiotics were administered.  The patient was placed in the supine position on the operating table.  General endotracheal anesthesia was induced uneventfully.  The patient was initially intubated with a dual-lumen endotracheal tube.  A Foley catheter was placed.  The patient's position on the table with a soft roll behind the right scapula and the neck gently extended and turned towards the left.  The patient's right neck, chest, abdomen, both groins, and both lower extremities were prepared and draped in a sterile manner.  Baseline transesophageal echocardiogram was performed confirming the presence of severe mitral regurgitation with flail segment of the anterior leaflet of the mitral valve and severe (4+) mitral regurgitation.  There is normal left ventricular systolic function.  No other significant abnormalities were noted.  A small incision was made in the right inguinal crease, and the anterior surface of the right common femoral artery and right common femoral vein were dissected through the incision.  The femoral artery is normal in appearance.  Single lung ventilation was begun.  A right miniature  anterolateral thoracotomy incision was performed.  The pectoralis major muscle was retracted medially and completely preserved.  The right pleural space was entered through the fourth intercostal space.  A soft tissue retractor was placed.  Two 11-mm ports were placed through separate stab incisions inferiorly.  The right pleural space was insufflated continuously with carbon dioxide gas through the posterior port.   A pledgeted suture was placed in the dome of the right hemidiaphragm and retracted inferiorly to facilitate exposure.  A longitudinal incision was made in the pericardium 3 cm anterior to the phrenic nerve.  Silk traction sutures were placed for exposure.  The patient was placed in Trendelenburg position.  The right internal jugular vein was cannulated with a Seldinger technique, and a flexible guidewire was advanced into the right atrium.  The patient is heparinized systemically.  Pursestring sutures were placed on the anterior surface of the right common femoral artery and the right common femoral vein.  The right common femoral vein was cannulated with Seldinger technique, and a flexible guidewire was advanced under transesophageal echocardiogram guidance through the right atrium until the guidewire goes up the superior vena cava.  The femoral vein was dilated with serial dilators and cannulated with a 22-French long femoral venous cannula.  The femoral artery was cannulated with a Seldinger technique, and a flexible guidewire was advanced until the guidewire can be appreciated intraluminally in the descending thoracic aorta.  The femoral artery was dilated with serial dilators and cannulated with an 18-French femoral arterial cannula.  The right internal jugular vein was dilated with serial dilators and cannulated with a 14-French pediatric femoral venous cannula.  Cardiopulmonary bypass was begun.  Vacuum-assist venous drainage was utilized.  Venous drainage and exposure was notably excellent.  The incision in the pericardium was extended in both directions.  A retrograde cardioplegic cannula was placed through the right atrium into the coronary sinus using transesophageal echocardiogram for guidance. An antegrade cardioplegic cannula was placed low in the proximal descending thoracic aorta.  The patient is cooled to 28 degrees systemic temperature.  The aortic crossclamp was  applied and cold blood cardioplegia was delivered initially in an antegrade fashion through the aortic root.  The initial cardioplegic arrest was rapid with an early diastolic arrest.  Supplemental cardioplegia was administered retrograde through the coronary sinus catheter.  Repeat doses of cardioplegia are administered intermittently throughout the entire crossclamp portion of the operation every 20 minutes to either antegrade through the aortic root or retrograde through the coronary sinus catheter to maintain completely flat electrocardiogram.  Myocardial protection was felt to be excellent.  A left atriotomy incision was performed posteriorly through the interatrial groove and continue partway across the back wall of the left atrium after opening the oblique sinus inferiorly.  Retractor blade was placed into the left atrium and fixed to a separate sidearm placed through a small stab incision just to the right side of the sternum through the third intercostal space.  Exposure of the floor of the left atrium and the mitral valve was notably excellent.  The mitral valve was carefully examined.  There is fibroelastic deficiency type degenerative disease with ruptured primary chords along the A2 and A3 segment of the anterior leaflet of the mitral valve and obvious flail segment with severe (4+) mitral regurgitation.  There is mild thickening of the anterior leaflet, but otherwise there are no other areas of elongated chords nor other abnormalities.  The entire posterior leaflet was normal.  The A1 portion of the  anterior leaflet was normal.  Interrupted 2-0 Ethibond horizontal mattress sutures were placed circumferentially around the entire mitral valve annulus.  These sutures will ultimately be utilized for ring annuloplasty and at this juncture, they are utilized to suspend the valve symmetrically.  A single secondary chord from the undersurface of the anterior leaflet  was transposed to the flail free margin by amputating it off of the undersurface of the anterior leaflet and reattaching it to the free margin with a figure-of-eight CV5 Gore-Tex suture.  To further support the flail area, a single primary cord from the normal posterior leaflet in the P2 region close to P3 was transposed to the anterior leaflet by removing a small quadrangular resection of the free margin of the posterior leaflet and flipping this to the free margin of the anterior leaflet and sewing it in place with two figure-of-eight CV5 Gore-Tex sutures.  The intervening defect left in the posterior leaflet was now closed with everting CV5 simple Gore-Tex sutures.  Artificial Gore-Tex needle chords were utilized to further treat the remaining prolapse involving A2 as the large area of flail free margin is not felt to be adequately supported using the native transposed chords by themselves. A pledgeted CV4 Gore-Tex sutures placed through the head of the posterior papillary muscle and the sutures tied.  Each limb of the suture was then woven into the anterior leaflet placing it initially from the ventricular to the atrial surface close to the free margin and weaving it an diamond shape direction towards the anterior annulus.  The ventricle was filled with saline before tying the suture to an appropriate length.  Two additional pairs of artificial Gore-Tex needlechords were also placed.  Each of these were placed through the same pair of pledgets from the original stitch in a figure-of-eight fashion into the posterior papillary muscle.  Each of these were again woven into the free margin of the anterior leaflet and tied after filling the ventricle with saline.  At this juncture, the valve appears to be competent and is subsequently sized to accept a 30-mm annuloplasty ring, which corresponds fairly closely to the dimensions of the anterior leaflet.  Ring annuloplasty was performed using  a Sorin Memo 3D annuloplasty ring (size 30 mm, catalog number [id number], serial number [id number]).  The ring was secured into place.  The valve was tested with saline.  The valve appears to be perfectly competent with a broad symmetrical line of coaptation.  Blue ink test was performed to ensure large surface area of coaptation.  Rewarming was begun.  A sump drain was placed across the mitral valve to serve as a left ventricular vent.  The left atriotomy incision was closed with a two- layer closure of running 3-0 Prolene suture.  The lungs were ventilated and heart allowed to fill while one final dose of warm retrograde hot shot cardioplegia was administered.  The aortic crossclamp was removed after a total crossclamp time of 160 minutes.  The heart began to beat spontaneously without need for cardioversion.  The retrograde cardioplegic cannula was removed.  Epicardial pacing wires affixed to the undersurface of the right ventricular free wall into the right atrial appendage.  The patient is rewarmed to 37 degrees centigrade temperature.  The lungs were ventilated and heart allowed to fill after which time the left ventricular vent was removed.  The lungs were again ventilated and the heart allowed to fill while the echocardiogram was briefly examined.  The valve repair appears intact.  The antegrade cardioplegic  cannula was now removed.  The patient is weaned from cardiopulmonary bypass without difficulty. The patient's rhythm at separation from bypass was AV paced.  No inotropic support was required.  Total cardiopulmonary bypass time for the operation is 208 minutes.  Followup transesophageal echocardiogram performed after separation from bypass demonstrates normal functioning mitral valve.  There is a well seated annuloplasty ring in the mitral position.  The valve was functioning normally with no residual mitral regurgitation whatsoever.  No other abnormalities are noted and  left ventricular function remained preserved.  Protamine was administered to reverse the anticoagulation.  The femoral venous and arterial cannulae are removed uneventfully.  There is a palpable pulse in the distal right femoral artery.  The right internal jugular cannula was removed uneventfully and manual pressure was held on the right neck for 30 minutes.  Single lung ventilation was begun.  The left atriotomy closures inspect for hemostasis.  The pericardial space was drained with a 28-French Bard chest tube placed through the anterior port incision.  The pericardium was closed laterally using a patch of core matrix bovine submucosal tissue patch.  The right pleural space was irrigated with saline solution and inspect for hemostasis.  The On-Q continuous pain management system was utilized to facilitate postoperative pain control. One 5-inch catheter supplied with the On-Q kit, was tunneled through the subcutaneous tissues to the posterior port incision and then tunneled through the intercostal space into the subpleural space posteriorly to cover the second through the sixth intercostal nerve roots.  The catheter was flushed with 5 mL of 0.5% bupivacaine solution and ultimately, connected to continuous infusion pump.  The right pleural space was drained with a 28-French Bard chest tube placed through the posterior port incision.  The minithoracotomy incision was closed in layers in routine fashion.  The right groin incision is closed in layers in routine fashion.  The patient tolerated the procedure well was reintubated with a single- lumen endotracheal tube and subsequently, transported to surgical intensive care unit in stable condition.  There are no intraoperative complications.  All sponge, instrument, and needle counts were verified correct at completion of the operation.  No blood products were administered.     Salvatore Decent. Cornelius Moras, M.D.     CHO/MEDQ  D:  06/14/2010   T:  06/15/2010  Job:  [id number]  cc:   Verne Carrow, MD Marca Ancona, MD Santiago Bumpers. Leveda Anna, M.D.  Electronically Signed by Tressie Stalker M.D. on 06/15/2010 08:09:21 AM

## 2010-06-16 ENCOUNTER — Inpatient Hospital Stay (HOSPITAL_COMMUNITY): Payer: Self-pay

## 2010-06-16 DIAGNOSIS — I059 Rheumatic mitral valve disease, unspecified: Secondary | ICD-10-CM

## 2010-06-16 LAB — BASIC METABOLIC PANEL
BUN: 14 mg/dL (ref 6–23)
Chloride: 101 mEq/L (ref 96–112)
Glucose, Bld: 136 mg/dL — ABNORMAL HIGH (ref 70–99)
Potassium: 4 mEq/L (ref 3.5–5.1)
Sodium: 135 mEq/L (ref 135–145)

## 2010-06-16 LAB — CBC
HCT: 32.4 % — ABNORMAL LOW (ref 39.0–52.0)
Hemoglobin: 11.4 g/dL — ABNORMAL LOW (ref 13.0–17.0)
MCH: 32.9 pg (ref 26.0–34.0)
MCHC: 35.2 g/dL (ref 30.0–36.0)

## 2010-06-17 ENCOUNTER — Inpatient Hospital Stay (HOSPITAL_COMMUNITY): Payer: Self-pay

## 2010-06-17 DIAGNOSIS — I4891 Unspecified atrial fibrillation: Secondary | ICD-10-CM

## 2010-06-17 LAB — TYPE AND SCREEN
Antibody Screen: NEGATIVE
Unit division: 0

## 2010-06-17 LAB — CBC
HCT: 31.5 % — ABNORMAL LOW (ref 39.0–52.0)
Hemoglobin: 11 g/dL — ABNORMAL LOW (ref 13.0–17.0)
MCHC: 34.9 g/dL (ref 30.0–36.0)
MCV: 93.8 fL (ref 78.0–100.0)
RDW: 13.7 % (ref 11.5–15.5)
WBC: 11.9 10*3/uL — ABNORMAL HIGH (ref 4.0–10.5)

## 2010-06-17 LAB — BASIC METABOLIC PANEL
CO2: 31 mEq/L (ref 19–32)
Chloride: 96 mEq/L (ref 96–112)
Creatinine, Ser: 0.71 mg/dL (ref 0.4–1.5)
GFR calc Af Amer: >60 mL/min (ref >60)
Potassium: 4.7 mEq/L (ref 3.5–5.1)

## 2010-06-18 LAB — CBC
HCT: 30.5 % — ABNORMAL LOW (ref 39.0–52.0)
MCH: 31.8 pg (ref 26.0–34.0)
MCV: 93.3 fL (ref 78.0–100.0)
Platelets: 98 10*3/uL — ABNORMAL LOW (ref 150–400)
RBC: 3.27 MIL/uL — ABNORMAL LOW (ref 4.22–5.81)
WBC: 7.2 10*3/uL (ref 4.0–10.5)

## 2010-06-20 ENCOUNTER — Ambulatory Visit (INDEPENDENT_AMBULATORY_CARE_PROVIDER_SITE_OTHER): Payer: Self-pay | Admitting: *Deleted

## 2010-06-20 DIAGNOSIS — I059 Rheumatic mitral valve disease, unspecified: Secondary | ICD-10-CM | POA: Insufficient documentation

## 2010-06-21 NOTE — Discharge Summary (Signed)
NAME:  Kenneth Mclaughlin, Kenneth Mclaughlin NO.:  [id number]  MEDICAL RECORD NO.:  [id number]           PATIENT TYPE:  I  LOCATION:  2019                         FACILITY:  [facility]  PHYSICIAN:  Salvatore Decent. Cornelius Moras, M.D. DATE OF BIRTH:  17-Oct-1945  DATE OF ADMISSION:  06/14/2010 DATE OF DISCHARGE:  06/18/2010                              DISCHARGE SUMMARY   ANTICIPATED DATE OF DISCHARGE:  June 18, 2010.  PRIMARY ADMITTING DIAGNOSIS:  Severe mitral regurgitation.  ADDITIONAL/DISCHARGE DIAGNOSES: 1. Severe mitral regurgitation. 2. Questionable history of atrial fibrillation. 3. Mitral valve prolapse. 4. Remote history of tobacco abuse. 5. Postoperative thrombocytopenia.  PROCEDURES PERFORMED: 1. Right minithoracotomy for mitral valve repair (complex     valvuloplasty including transposition of native chordae tendineae     x2 and artificial Gore-Tex cord replacement x6 with 30-mm Sorin     Memo 3-D ring annuloplasty).HISTORY:  The patient is a 65 year old male with no known history of coronary artery disease.  Since November 2011, the patient has complained of persistent dry cough with increasing palpitations and worsening exertional shortness of breath.  His symptoms had accelerated over the past 2 months and he was subsequently seen by Dr. Doralee Albino, who noted heart murmur on physical exam.  He underwent a 2-D echocardiogram which revealed severe mitral regurgitation with moderate left ventricular chamber enlargement.  He was referred to Dr. 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  The aortic valve was normal.  Left ventricular function was preserved with ejection fraction estimated at 55% with mild left ventricular chamber enlargement.  He then underwent cardiac catheterization by Dr. Clifton James, which showed no  significant coronary artery disease.  PA pressure was 36/21 with pulmonary capillary wedge pressure of 25, cardiac output of 6.15 liters per minute and cardiac index of 2.77.  Imaging of his descending abdominal aorta and iliac vessels revealed no sign of aortoiliac occlusive disease.  Because of his symptoms and severe MR, he was referred to Dr. Tressie Stalker as an outpatient consultation for consideration of mitral valve repair or replacement.  Dr. Cornelius Moras saw the patient and reviewed his films and agreed with the need for surgical intervention.  There was a question of a diagnosis of atrial fibrillation, although the patient was noted in sinus rhythm on both his evaluations at [facility] as well as his previous evaluation by Cardiology. After discussion with the patient and his family, it was felt he would be a good candidate for minimally invasive mitral valve repair.  All risks, benefits and alternatives were explained to the patient.  He agreed to proceed.  HOSPITAL COURSE:  Kenneth Mclaughlin was admitted to [facility] on Jun 14, 2010.  He was taken to the operating room where he underwent a right minithoracotomy for mitral valve repair as described above.  Please see previously dictated operative report for complete details of surgery. He tolerated the procedure well and was transferred to the SICU in stable condition.  He was able to  be extubated shortly after surgery. He was hemodynamically stable and doing well on postop day #1.  He initially had some hypotension and required low-dose NeoSynephrine drip. This was weaned and discontinued over his first postoperative day.  By postop day #2, he was ready for transfer to the step-down unit. Overall, he has progressed very well.  His chest tube drainage had dwindled and by postop day #3, his chest tubes were able to be discontinued.  He has been mildly volume overloaded and was started on Lasix to which he is responding well.  His  incisions are all healing well.  He has been maintaining sinus rhythm and has been started on a beta-blocker and continued on low-dose p.o. amiodarone.  He has had a thrombocytopenia postoperatively, but at the time of this dictation, his platelet count has leveled off.  We will repeat a CBC on June 18, 2010, to further monitor this issue.  He has been started on Coumadin, and currently, his PT is 19.3 with an INR of 1.61.  His other labs on postop day #3, show hemoglobin of 11, hematocrit 31.5, platelets 71,000, white count 11.9, sodium 130, potassium 4.7, BUN 11, creatinine 0.71.  He remains approximately 3 kg above his preoperative weight with mild lower extremity edema on physical exam.  He has been weaned from supplemental oxygen.  He is ambulating in the halls with cardiac rehab phase I and is progressing well with mobility.  He is tolerating a regular diet.  It is anticipated that if he continues to progress well over the next 24 hours he will hopefully be ready for discharge home on June 18, 2010.  DISCHARGE MEDICATIONS: 1. Lasix 40 mg daily x7 days. 2. Oxycodone IR 5-10 mg q.3-4 h. p.r.n. for pain. 3. Potassium 20 mEq daily x7 days. 4. Coumadin 2.5 mg daily or as directed. 5. Amiodarone 200 mg b.i.d. 6. Aspirin 81 mg daily. 7. Diazepam 5 mg q.8 h. p.r.n. for back spasms. 8. Glucosamine 1500 mg daily. 9. Metoprolol 25 mg b.i.d. 10.Multivitamin daily.  DISCHARGE INSTRUCTIONS:  He is asked to refrain from driving, heavy lifting or strenuous activity.  He may continue ambulating daily and using his incentive spirometer.  He may shower daily and clean his incisions with soap and water.  He may continue low-fat, low-sodium diet.  DISCHARGE FOLLOWUP:  He will need to have a PT and INR drawn on Monday June 20, 2010, at the [facility].  He will also need to make an appointment to see Dr. Clifton James in 2 weeks.  He will then see Dr. Cornelius Moras on July 04, 2010, with a chest  x-ray from [facility].  If he experiences any problems or has questions in the interim, he is asked to contact our office immediately.     Coral Ceo, P.A.   ______________________________ Salvatore Decent. Cornelius Moras, M.D.    GC/MEDQ  D:  06/17/2010  T:  06/17/2010  Job:  [id number]  cc:   Verne Carrow, MD Santiago Bumpers. Leveda Anna, M.D. [facility] Office  Electronically Signed by Weldon Inches. on 06/20/2010 02:53:15 PM Electronically Signed by Tressie Stalker M.D. on 06/21/2010 03:47:43 PM

## 2010-06-24 ENCOUNTER — Encounter (INDEPENDENT_AMBULATORY_CARE_PROVIDER_SITE_OTHER): Payer: Self-pay

## 2010-06-24 ENCOUNTER — Ambulatory Visit (INDEPENDENT_AMBULATORY_CARE_PROVIDER_SITE_OTHER): Payer: Self-pay | Admitting: *Deleted

## 2010-06-24 DIAGNOSIS — I059 Rheumatic mitral valve disease, unspecified: Secondary | ICD-10-CM

## 2010-06-25 NOTE — Assessment & Plan Note (Signed)
OFFICE VISIT  Kenneth, Mclaughlin DOB:  [birth year]                                        June 24, 2010 CHART #:  [id number]  HISTORY OF PRESENT ILLNESS:  The patient returns for wound check and skin suture removal having just gone home from the hospital last week following right miniature thoracotomy for mitral valve repair.  His postoperative recovery has been uncomplicated.  He reports that since hospital discharge, he is doing exceptionally well with his only complaint being that he has some pain and numbness along the lateral aspect of his right thigh.  He otherwise feels great.  He has minimal or no chest pain.  He has no shortness of breath.  His activity level is quite good and the only thing that slows him down is the pain in the lateral aspect of his right hip.  The remainder of his review of systems is unremarkable.  PHYSICAL EXAMINATION:  General:  Notable for well-appearing male in no acute distress.  His mini thoracotomy incision is healing nicely.  Chest tube sutures have been removed.  Cardiovascular:  Notable for regular rate and rhythm.  No murmurs, rubs, or gallops are noted.  Pelvic:  The right groin incision is healing nicely.  There is no palpable fluid collection.  No tenderness in this region.  Distal pulses are palpable including but posterior tibial and dorsalis pedis.  No other abnormalities are noted.  IMPRESSION:  Pain and numbness that appears to be in the distribution of the lateral femoral cutaneous nerve on the right side.  This is presumably related to the surgical incision in the right groin and I suspect this is neurogenic in origin.  He has palpable pulses and I am doubtful he has any sort of arterial problem.  There is no sign of any lymphocele and the incision is healing nicely.  PLAN:  I have reassured the patient that this is probably related to nerve stretch or injury at the time of his right groin incision  and hopefully the pain and numbness will continue to improve with time.  All of his questions have been addressed.  He will return for further followup on June 18 as previously scheduled.  [name], M.D. Electronically Signed  CHO/MEDQ  D:  06/24/2010  T:  06/25/2010  Job:  [id number]

## 2010-06-30 ENCOUNTER — Ambulatory Visit (INDEPENDENT_AMBULATORY_CARE_PROVIDER_SITE_OTHER): Payer: Self-pay | Admitting: *Deleted

## 2010-06-30 DIAGNOSIS — I4891 Unspecified atrial fibrillation: Secondary | ICD-10-CM

## 2010-06-30 DIAGNOSIS — Z7901 Long term (current) use of anticoagulants: Secondary | ICD-10-CM

## 2010-06-30 LAB — POCT INR: INR: 2.6

## 2010-07-01 ENCOUNTER — Other Ambulatory Visit: Payer: Self-pay | Admitting: Thoracic Surgery (Cardiothoracic Vascular Surgery)

## 2010-07-01 ENCOUNTER — Encounter: Payer: Self-pay | Admitting: *Deleted

## 2010-07-01 DIAGNOSIS — I059 Rheumatic mitral valve disease, unspecified: Secondary | ICD-10-CM

## 2010-07-04 ENCOUNTER — Ambulatory Visit
Admission: RE | Admit: 2010-07-04 | Discharge: 2010-07-04 | Disposition: A | Discharge status: Home / Self Care | Payer: Self-pay | Source: Ambulatory Visit | Attending: Thoracic Surgery (Cardiothoracic Vascular Surgery) | Admitting: Thoracic Surgery (Cardiothoracic Vascular Surgery)

## 2010-07-04 ENCOUNTER — Encounter: Payer: Self-pay | Admitting: Thoracic Surgery (Cardiothoracic Vascular Surgery)

## 2010-07-04 ENCOUNTER — Encounter (INDEPENDENT_AMBULATORY_CARE_PROVIDER_SITE_OTHER): Payer: Self-pay | Admitting: Thoracic Surgery (Cardiothoracic Vascular Surgery)

## 2010-07-04 DIAGNOSIS — I059 Rheumatic mitral valve disease, unspecified: Secondary | ICD-10-CM

## 2010-07-05 NOTE — Assessment & Plan Note (Signed)
OFFICE VISIT  Kenneth, Mclaughlin DOB:  Sep 22, 1945                                        July 04, 2010 CHART #:  [id number]  HISTORY OF PRESENT ILLNESS:  The patient returns for routine followup, status post right miniature thoracotomy for mitral valve repair on Jun 14, 2010.  His postoperative recovery has been uncomplicated.  Since hospital discharge, she has continued to recover uneventfully.  He did have some numbness along his right thigh which has improved. Unfortunately, last week his mother passed away somewhat unexpectedly. Despite this, the patient continues to do fairly well.  He does note that he spent a lot of time at his mother's funeral and at the visitation.  He thinks that he shook probably 200 peoples hands.  After this, he started having some pain in his right forearm with occasional sensations of paraesthesias and shooting pain in the radial nerve distribution.  This all happened after his mother's passing and the visitation episode.  He otherwise feels quite well.  He has minimal soreness in his chest.  He has no shortness of breath.  The dry cough he had experienced for the past year has completely resolved.  His appetite is good.  He is sleeping fairly well at night.  He has no other complaints.  PHYSICAL EXAMINATION:  General:  Well-appearing male.  Vital Signs: Blood pressure 114/73, pulse 69 regular, oxygen saturation 98% on room air.  Chest:  Examination of the chest reveals mini thoracotomy incision that is healing nicely.  Breath sounds are clear to auscultation and symmetrical bilaterally.  No wheezes, rales or rhonchi are noted. Cardiovascular:  Demonstrates regular rate and rhythm.  No murmurs, rubs or gallops are appreciated.  Abdomen:  Soft and nontender.  Extremities: Warm and well-perfused.  There is no lower extremity edema.  The small incision in the right groin is healing nicely.  The remainder of his physical exam is  noncontributory.  DIAGNOSTIC TESTS:  Chest x-ray performed today at the [facility] is reviewed.  This reveals clear lung fields bilaterally with no significant pleural effusions.  No other significant abnormalities are noted.  IMPRESSION:  Excellent progress following recent mitral valve repair. The patient does have some unusual pain in the distribution of his radial nerve in the right forearm that all seemed to develop after his mother's visitation episode this past week when he was shaking lots of hands.  The symptoms and signs do not suggest central neurologic event. Clinically, he is otherwise doing quite well.  PLAN:  I have mentioned to the patient that he should keep an eye on these signs of pain and paraesthesias in his right forearm.  If they do not resolve within the next 2 or 3 weeks, it would be reasonable to consider referral to a hand surgeon for a formal consultation.  However, I suspect that this will probably resolve uneventfully.  Otherwise, I have encouraged him to continue gradually increase his physical activity as tolerated.  I have reminded him that he should refrain from any heavy lifting or strenuous use of his arms or shoulders, but otherwise he can start pushing himself more physically.  We have talked about the cardiac rehab program and he may do this through the Rehab Program in [place].  I have also instructed him to cut his dose of amiodarone back  to 200 mg daily.  When the current prescription runs out, he will stop amiodarone altogether.  He will remain on Coumadin for 3 months after at the time of his surgery.  We will plan to see him back in 4 weeks for further followup.  Kenneth Mclaughlin, M.D. Electronically Signed  CHO/MEDQ  D:  07/04/2010  T:  07/05/2010  Job:  [id number]  cc:   Kenneth Carrow, MD Kenneth Mclaughlin, M.D.

## 2010-07-06 ENCOUNTER — Ambulatory Visit (INDEPENDENT_AMBULATORY_CARE_PROVIDER_SITE_OTHER): Payer: Self-pay | Admitting: *Deleted

## 2010-07-06 ENCOUNTER — Ambulatory Visit (INDEPENDENT_AMBULATORY_CARE_PROVIDER_SITE_OTHER): Payer: Self-pay | Admitting: Cardiovascular Disease

## 2010-07-06 ENCOUNTER — Encounter: Payer: Self-pay | Admitting: Cardiovascular Disease

## 2010-07-06 VITALS — BP 104/69 | HR 73 | Ht 74.0 in | Wt 210.0 lb

## 2010-07-06 DIAGNOSIS — I4891 Unspecified atrial fibrillation: Secondary | ICD-10-CM

## 2010-07-06 DIAGNOSIS — I059 Rheumatic mitral valve disease, unspecified: Secondary | ICD-10-CM

## 2010-07-06 DIAGNOSIS — Z7901 Long term (current) use of anticoagulants: Secondary | ICD-10-CM

## 2010-07-06 LAB — POCT INR: INR: 2.7

## 2010-07-06 NOTE — Assessment & Plan Note (Signed)
Maintaining sinus rhythm. Will d/c amiodarone per Dr. Deroy Dienes plans and probably stop coumadin in next several months.

## 2010-07-06 NOTE — Assessment & Plan Note (Signed)
Stable post mitral valve repair. He is to f/u with Dr. Cornelius Moras in several weeks. Will plan repeat echo when dr. Cornelius Moras feels necessary post mitral repair.

## 2010-07-06 NOTE — Progress Notes (Signed)
History of Present Illness:65 yo WM with history of severe MR here today for f/u. I saw him as a new pt 05/11/10 for evaluation of new systolic murmur and echo showed severe MR with likely flail leaflet. He underwent cardiac cath 05/23/10 which showed mild non-obstructive CAD and mild LV dysfunction with EF of 50%. TEE 05/13/10 with evidence of flail anterior mitral leaflet. He was seen by Dr. Cornelius Moras and has undergone mitral valve repair. He had atrial fibrillation post-op and was started on amiodarone and coumadin. He is doing well. He has had no chest pain or SOB. His cough is resolved. He saw Dr. Cornelius Moras last week. Plans to stop amiodarone this week and coumadin in the next few months. He is in sinus rhythm today.   Past Medical History  Diagnosis Date  . Mitral regurgitation   . Myxomatous degeneration of mitral valve   . Atrial fibrillation     details unclear    Past Surgical History  Procedure Date  . Tonsillectomy   . Shoulder surgery     Current Outpatient Prescriptions  Medication Sig Dispense Refill  . amiodarone (PACERONE) 200 MG tablet Take 200 mg by mouth daily.        Marland Kitchen aspirin 81 MG tablet Take 81 mg by mouth daily.        . diazepam (VALIUM) 5 MG tablet Take 5 mg by mouth as needed.        . Glucosamine-Chondroit-Vit C-Mn (GLUCOSAMINE 1500 COMPLEX PO) Take 1,500 mg by mouth daily.        . metoprolol tartrate (LOPRESSOR) 25 MG tablet Take 1 tablet (25 mg total) by mouth 2 (two) times daily.  180 tablet  3  . Multiple Vitamin (MULTIVITAMIN) tablet Take 1 tablet by mouth daily.        Marland Kitchen warfarin (COUMADIN) 2.5 MG tablet As directed       . DISCONTD: aspirin 325 MG tablet Take 325 mg by mouth daily.        Marland Kitchen DISCONTD: hydrALAZINE (APRESOLINE) 25 MG tablet Take 1 tablet (25 mg total) by mouth 3 (three) times daily.  90 tablet  6    No Known Allergies  History   Social History  . Marital Status: Married    Spouse Name: N/A    Number of Children: N/A  . Years of Education: N/A    Occupational History  . Not on file.   Social History Main Topics  . Smoking status: Former Smoker    Quit date: 03/14/1977  . Smokeless tobacco: Never Used  . Alcohol Use: 1.5 oz/week    3 drink(s) per week  . Drug Use: No  . Sexually Active: Not on file   Other Topics Concern  . Not on file   Social History Narrative  . No narrative on file    Family History  Problem Relation Age of Onset  . Heart failure Mother   . Heart disease Brother   . Heart disease Brother     Review of Systems:  As stated in the HPI and otherwise negative.   BP 104/69  Pulse 73  Ht 6\' 2"  (1.88 m)  Wt 210 lb (95.255 kg)  BMI 26.96 kg/m2  Physical Examination: General: Well developed, well nourished, NAD HEENT: OP clear, mucus membranes moist SKIN: warm, dry. No rashes. Neuro: No focal deficits Musculoskeletal: Muscle strength 5/5 all ext Psychiatric: Mood and affect normal Neck: No JVD, no carotid bruits, no thyromegaly, no lymphadenopathy. Lungs:Clear bilaterally, no wheezes,  rhonci, crackles Cardiovascular: Regular rate and rhythm. No murmurs, gallops or rubs. Abdomen:Soft. Bowel sounds present. Non-tender.  Extremities: No lower extremity edema. Pulses are 2 + in the bilateral DP/PT.  EKG:NSR with first degree AV block. LAFB. Non-specific T wave abnormalities.

## 2010-07-07 ENCOUNTER — Encounter: Payer: Self-pay | Admitting: *Deleted

## 2010-07-15 ENCOUNTER — Ambulatory Visit (INDEPENDENT_AMBULATORY_CARE_PROVIDER_SITE_OTHER): Payer: Self-pay | Admitting: Family Medicine

## 2010-07-15 ENCOUNTER — Encounter: Payer: Self-pay | Admitting: Family Medicine

## 2010-07-15 DIAGNOSIS — S5420XA Injury of radial nerve at forearm level, unspecified arm, initial encounter: Secondary | ICD-10-CM

## 2010-07-15 DIAGNOSIS — IMO0002 Reserved for concepts with insufficient information to code with codable children: Secondary | ICD-10-CM | POA: Insufficient documentation

## 2010-07-15 DIAGNOSIS — S63639A Sprain of interphalangeal joint of unspecified finger, initial encounter: Secondary | ICD-10-CM

## 2010-07-15 NOTE — Progress Notes (Signed)
  Subjective:    Patient ID: Kenneth Mclaughlin, male    DOB: 1945/05/10, 65 y.o.   MRN: [id number]  HPI  #1. Some shooting pains in his right forearm. He had mitral valve replacement several weeks ago and noticed this sometime after the surgery. It is worse with certain motions. He has not really had any numbness. The pain starts around the lateral elbow and across the volar forearm.  #2. Inability to flex his right index finger at the distal joint. He thinks this occurred about a week ago. The only thing he can relate it to is he was at a funeral and did a lot of handshaking and/or opening. He does not recall a specific event.  PERTINENT  PMH / PSH:  He has had prior DIP joint fracture when he was a young kit. Previous history of surgery on his right shoulder after dislocation.  Review of Systems    no fever, no unusual weight change. Objective:   Physical Exam     GENERAL: Well-developed male no acute distress Right shoulder. Well-healed anterior scar. The biceps tendon is nontender to palpation. His bicep strength is intact. Right elbow he is intact with full range of motion. There is no tenderness over the lateral muscle mass at the lateral epicondyle. Wrist strength in extension and flexion as well as elbow flexion extension is normal. HAND: normal exam except for inability to flex at the index DIP iright. . Skeletal ultrasound: The flexor tendon of the right index finger appears intact on the longitudinal view but on the transfer sheet there is an apparent partial tear in the mid substance. I do not see any increased vascularity.   Assessment & Plan:  #1. Neuropathy of the radial nerve, anterior interosseous nerve. I think this will heal on its own. Probably related to positioning while in surgery. His surgical procedure lasted more than 6 hours. #2. Partial flexor tendon rupture. The patient in a plaster splint today that keeps him in 30 of flexion at the right index and long finger DIP,  PIP, MCP. See him back in one week for change of splint.

## 2010-07-21 ENCOUNTER — Ambulatory Visit (INDEPENDENT_AMBULATORY_CARE_PROVIDER_SITE_OTHER): Payer: Self-pay | Admitting: *Deleted

## 2010-07-21 DIAGNOSIS — Z7901 Long term (current) use of anticoagulants: Secondary | ICD-10-CM

## 2010-07-21 DIAGNOSIS — I4891 Unspecified atrial fibrillation: Secondary | ICD-10-CM

## 2010-07-21 LAB — POCT INR: INR: 2.9

## 2010-07-22 ENCOUNTER — Ambulatory Visit (INDEPENDENT_AMBULATORY_CARE_PROVIDER_SITE_OTHER): Payer: Self-pay | Admitting: Family Medicine

## 2010-07-22 DIAGNOSIS — S63639A Sprain of interphalangeal joint of unspecified finger, initial encounter: Secondary | ICD-10-CM

## 2010-07-22 DIAGNOSIS — IMO0002 Reserved for concepts with insufficient information to code with codable children: Secondary | ICD-10-CM

## 2010-07-22 NOTE — Progress Notes (Signed)
  Subjective:    Patient ID: Kenneth Mclaughlin, male    DOB: 1945/08/05, 65 y.o.   MRN: [id number]  HPI  Followup right index finger tendon injury. He's been in the splint now since I saw him last. He's not having any issues.  Review of Systems No fever    Objective:   Physical Exam     GENERAL: Well-developed male no acute distress RIGHT hand. Splint was removed. His skin is intact. He has some stiffness at the index PIP and he is still unable to flex his distal finger joint.   Assessment & Plan:  Flexor tendon injury. We have immobilized him now and hopefully he has had time to heal. About to start him on some hand rehabilitation so we'll have him baby the hand and tries to do anything significant with it other than multiple flexion exercises during the day using a small squishy ball. I'll see him back in 10 days. He would like to avoid having to go to a hand surgeon if at all possible.

## 2010-08-01 ENCOUNTER — Ambulatory Visit (INDEPENDENT_AMBULATORY_CARE_PROVIDER_SITE_OTHER): Payer: Self-pay | Admitting: Family Medicine

## 2010-08-01 ENCOUNTER — Encounter: Payer: Self-pay | Admitting: Family Medicine

## 2010-08-01 ENCOUNTER — Encounter: Payer: Self-pay | Admitting: Thoracic Surgery (Cardiothoracic Vascular Surgery)

## 2010-08-01 ENCOUNTER — Ambulatory Visit (INDEPENDENT_AMBULATORY_CARE_PROVIDER_SITE_OTHER): Payer: Self-pay | Admitting: Thoracic Surgery (Cardiothoracic Vascular Surgery)

## 2010-08-01 VITALS — BP 128/78 | HR 72 | Resp 18

## 2010-08-01 DIAGNOSIS — M24419 Recurrent dislocation, unspecified shoulder: Secondary | ICD-10-CM

## 2010-08-01 DIAGNOSIS — S5420XA Injury of radial nerve at forearm level, unspecified arm, initial encounter: Secondary | ICD-10-CM

## 2010-08-01 DIAGNOSIS — I059 Rheumatic mitral valve disease, unspecified: Secondary | ICD-10-CM

## 2010-08-01 DIAGNOSIS — Z09 Encounter for follow-up examination after completed treatment for conditions other than malignant neoplasm: Secondary | ICD-10-CM

## 2010-08-01 DIAGNOSIS — S63639A Sprain of interphalangeal joint of unspecified finger, initial encounter: Secondary | ICD-10-CM

## 2010-08-01 DIAGNOSIS — Z9889 Other specified postprocedural states: Secondary | ICD-10-CM

## 2010-08-01 DIAGNOSIS — IMO0002 Reserved for concepts with insufficient information to code with codable children: Secondary | ICD-10-CM

## 2010-08-01 NOTE — Assessment & Plan Note (Signed)
OFFICE VISIT  ARIEZ, NEILAN DOB:  05/24/1945                                        August 01, 2010 CHART #:  [id number]  HISTORY:  The patient returns for further followup having previously undergone right miniature thoracotomy for mitral valve repair on Jun 14, 2010.  He was last seen here in the office June 24, 2010.  Since then, he has done well, although he continues to have problems with his right hand.  He apparently has been seen and evaluated [facility] and was actually temporarily put in a splint. Despite this, his symptoms continue.  From a cardiovascular standpoint, he is doing exceptionally well.  He has no shortness of breath.  He has trivial residual pain from his right thoracotomy.  He has no tachy palpitations.  The remainder of his review of systems are unremarkable.  PHYSICAL EXAMINATION:  Notable for well-appearing male with blood pressure 128/78, pulse 72 and regular and oxygen saturation 97% on room air.  His mini thoracotomy incision has healed nicely.  Auscultation reveals clear breath sounds that are symmetrical bilaterally. Cardiovascular exam includes regular rate and rhythm.  No murmurs, rubs or gallops are noted.  IMPRESSION:  The patient is doing well with respect to his mitral valve surgery.  He continues to have problems with his hand which he states began acutely after he was shaking many hands all day at his mother's funeral several weeks after his surgery.  His symptoms seem to be clearly peripheral and not suggestive of a central neurologic event such as a stroke.  However, formal evaluation is currently being deferred to [facility].  From a cardiovascular standpoint, the patient is doing quite well.  PLAN:  We will plan to see the patient back in 3 months.  I think it would be reasonable to consider stopping Coumadin in 6 or 8 weeks if he continues to do well.   At some point, a routine followup echocardiogram to serve as a new baseline would be appropriate. We will look for the results of this echocardiogram when he returns to see [place] in 3 months.  Salvatore Decent. Cornelius Moras, M.D. Electronically Signed  CHO/MEDQ  D:  08/01/2010  T:  08/01/2010  Job:  [id number]  cc:   Verne Carrow, MD Santiago Bumpers. Leveda Anna, M.D.

## 2010-08-02 ENCOUNTER — Encounter: Payer: Self-pay | Admitting: Family Medicine

## 2010-08-02 DIAGNOSIS — M24419 Recurrent dislocation, unspecified shoulder: Secondary | ICD-10-CM | POA: Insufficient documentation

## 2010-08-02 NOTE — Progress Notes (Signed)
  Subjective:    Patient ID: Kenneth Mclaughlin, male    DOB: July 10, 1945, 65 y.o.   MRN: [id number]  HPI #1. Followup right index finger tendon injury. Is not having any pain. He has regained only a slight bit of movement and strength. He is doing his exercises religiously. #2. Sharp shooting pains and numbness in his right forearm has continued and now he's having some hyper sensitivity of the skin. The sensitivities area is on his forearm on the thumb side.   Review of Systems Denies fever. He is essentially feeling great since his heart valve replacement. General: Well-developed male no acute distress    Objective:   Physical Exam    Right index finger: He has about 3 of movement of the distal portion of the phalanx. He has some stiffness with passive range of motion but no pain. Right forearm: The area he points to for the numbness and hyperesthesia is an area of the superficial radial nerve. Right shoulder. Well-healed scar from prior surgery for repetitive dislocation. He has decreased muscle mass of the deltoid and the pectoral muscle. He has pain with O'Briens testing and impingement signs. His entire shoulder is generally weak compared with the left generally 4/5 in all planes the rotator cuff.   INJECTION: Patient was given informed consent, signed copy in the chart. Appropriate time out was taken. Area prepped and draped in usual sterile fashion. 1 cc of kenalog plus  4 cc of lidocaine was injected into the right subacromial bursa using a(n) posterior approach. The patient tolerated the procedure well. There were no complications. Post procedure instructions were given.   Assessment & Plan:  #1. Flexor tendon injury. We discussed. I have tried immobilizing it to allow time for today he'll and we'll now start him in rehabilitation. I think at this point his best option of regaining function would be to go see a Hydrographic surveyor. Due to his financial limitations he doesn't want to do that  right now and I understand that. We will continue with the rehabilitation at home.  #2. Right forearm paresthesias and hyperesthesia and I think during his major heart surgery the right shoulder was placed in significant abduction. He very had some surgery on his shoulder. I think he sustained a mild brachioplexus or radial nerve injury that seems to be affecting him mostly in the sensory portion of the forearm. He also had some impingement signs today on testing. We discussed options and tried a corticosteroid injection into the subacromial bursa. He'll let me know how this does. I will follow him up when necessary

## 2010-08-08 ENCOUNTER — Ambulatory Visit (INDEPENDENT_AMBULATORY_CARE_PROVIDER_SITE_OTHER): Payer: Self-pay | Admitting: *Deleted

## 2010-08-08 DIAGNOSIS — Z7901 Long term (current) use of anticoagulants: Secondary | ICD-10-CM

## 2010-08-08 DIAGNOSIS — I4891 Unspecified atrial fibrillation: Secondary | ICD-10-CM

## 2010-08-08 LAB — POCT INR: INR: 2.2

## 2010-08-19 ENCOUNTER — Other Ambulatory Visit: Payer: Self-pay | Admitting: Cardiovascular Disease

## 2010-08-19 MED ORDER — WARFARIN SODIUM 2.5 MG PO TABS: ORAL_TABLET | ORAL | Status: DC

## 2010-08-19 NOTE — Telephone Encounter (Signed)
Pt has called twice and now pharmacy is calling to check on the status of pt RX refill. Pt wants to make sure this RX is called in today.

## 2010-08-19 NOTE — Telephone Encounter (Signed)
Pt called back and stated he  Is out of this medication.  Please call in today

## 2010-08-19 NOTE — Telephone Encounter (Signed)
Pt said Warfarin was originally prescribed by Dr. Cornelius Moras, yet pt has been released from Dr. Orvan July care, therefore pt would like Dr. Clifton James to take over the prescribing of pt Warfarin and call in a RX to [facility] in [facility].

## 2010-08-23 ENCOUNTER — Other Ambulatory Visit: Payer: Self-pay | Admitting: *Deleted

## 2010-08-23 MED ORDER — WARFARIN SODIUM 2.5 MG PO TABS: ORAL_TABLET | ORAL | Status: DC

## 2010-08-29 ENCOUNTER — Ambulatory Visit (INDEPENDENT_AMBULATORY_CARE_PROVIDER_SITE_OTHER): Payer: Self-pay | Admitting: *Deleted

## 2010-08-29 DIAGNOSIS — I4891 Unspecified atrial fibrillation: Secondary | ICD-10-CM

## 2010-08-29 DIAGNOSIS — Z7901 Long term (current) use of anticoagulants: Secondary | ICD-10-CM

## 2010-08-29 LAB — POCT INR: INR: 2

## 2010-09-12 ENCOUNTER — Ambulatory Visit (INDEPENDENT_AMBULATORY_CARE_PROVIDER_SITE_OTHER): Payer: Self-pay | Admitting: *Deleted

## 2010-09-12 DIAGNOSIS — Z7901 Long term (current) use of anticoagulants: Secondary | ICD-10-CM

## 2010-09-12 DIAGNOSIS — I4891 Unspecified atrial fibrillation: Secondary | ICD-10-CM

## 2010-09-12 LAB — POCT INR: INR: 2.9

## 2010-09-13 ENCOUNTER — Telehealth: Payer: Self-pay | Admitting: Cardiology

## 2010-09-13 DIAGNOSIS — Z9889 Other specified postprocedural states: Secondary | ICD-10-CM

## 2010-09-13 NOTE — Telephone Encounter (Signed)
Per Dr. Clifton James, echo scheduled for s/p mitral valve repair for 10/12/10 with a f/u office visit the same day.

## 2010-09-16 NOTE — Progress Notes (Signed)
Created prior to Epic go-live at [facility].  Closing encounter. ° °

## 2010-10-03 ENCOUNTER — Ambulatory Visit (INDEPENDENT_AMBULATORY_CARE_PROVIDER_SITE_OTHER): Payer: Self-pay | Admitting: *Deleted

## 2010-10-03 DIAGNOSIS — Z7901 Long term (current) use of anticoagulants: Secondary | ICD-10-CM

## 2010-10-03 DIAGNOSIS — I4891 Unspecified atrial fibrillation: Secondary | ICD-10-CM

## 2010-10-12 ENCOUNTER — Ambulatory Visit: Payer: Self-pay | Admitting: Cardiovascular Disease

## 2010-10-12 ENCOUNTER — Ambulatory Visit (HOSPITAL_COMMUNITY): Payer: Self-pay | Attending: Cardiovascular Disease | Admitting: Radiology

## 2010-10-12 ENCOUNTER — Encounter: Payer: Self-pay | Admitting: Cardiovascular Disease

## 2010-10-12 ENCOUNTER — Ambulatory Visit (INDEPENDENT_AMBULATORY_CARE_PROVIDER_SITE_OTHER): Payer: Self-pay | Admitting: Cardiovascular Disease

## 2010-10-12 VITALS — BP 114/74 | HR 69 | Ht 74.0 in | Wt 214.0 lb

## 2010-10-12 DIAGNOSIS — I379 Nonrheumatic pulmonary valve disorder, unspecified: Secondary | ICD-10-CM | POA: Insufficient documentation

## 2010-10-12 DIAGNOSIS — I079 Rheumatic tricuspid valve disease, unspecified: Secondary | ICD-10-CM | POA: Insufficient documentation

## 2010-10-12 DIAGNOSIS — Z9889 Other specified postprocedural states: Secondary | ICD-10-CM

## 2010-10-12 DIAGNOSIS — I359 Nonrheumatic aortic valve disorder, unspecified: Secondary | ICD-10-CM | POA: Insufficient documentation

## 2010-10-12 DIAGNOSIS — I059 Rheumatic mitral valve disease, unspecified: Secondary | ICD-10-CM

## 2010-10-12 DIAGNOSIS — I4891 Unspecified atrial fibrillation: Secondary | ICD-10-CM

## 2010-10-12 NOTE — Assessment & Plan Note (Addendum)
S/p mitral valve repair this year. Symptoms stable. Echo today with results as above.

## 2010-10-12 NOTE — Patient Instructions (Signed)
Your physician wants you to follow-up in: 12 months. You will receive a reminder letter in the mail two months in advance. If you don't receive a letter, please call our office to schedule the follow-up appointment.  Your physician has recommended you make the following change in your medication: Stop Coumadin

## 2010-10-12 NOTE — Progress Notes (Signed)
History of Present Illness:65 yo WM with history of severe MR here today for f/u. I saw him as a new pt 05/11/10 for evaluation of new systolic murmur and echo showed severe MR with likely flail leaflet. He underwent cardiac cath 05/23/10 which showed mild non-obstructive CAD and mild LV dysfunction with EF of 50%. TEE 05/13/10 with evidence of flail anterior mitral leaflet. He  has undergone mitral valve repair per Dr. Cornelius Moras . He had atrial fibrillation post-op and was started on amiodarone and coumadin. He has been on metoprol and coumadin since surgery.   He tells me today that he has been feeling well. No chest pain, SOB, dizziness, near syncope or syncope. He has not been aware of any irregularity of his heart rhythm. No palpitations.  Energy level is better than it has been in over a year. Echo today with normal LV systolic function, moderate LVH, appropriate appearance of repaired mitral valve.       Past Medical History  Diagnosis Date  . Mitral regurgitation   . Myxomatous degeneration of mitral valve   . Atrial fibrillation     Post-op. No recurrence.     Past Surgical History  Procedure Date  . Tonsillectomy   . Shoulder surgery   . Mitral valve repair     Current Outpatient Prescriptions  Medication Sig Dispense Refill  . aspirin 81 MG tablet Take 81 mg by mouth daily.        . diazepam (VALIUM) 5 MG tablet Take 5 mg by mouth as needed.        . Glucosamine-Chondroit-Vit C-Mn (GLUCOSAMINE 1500 COMPLEX PO) Take 1,500 mg by mouth daily.        . metoprolol tartrate (LOPRESSOR) 25 MG tablet Take 1 tablet (25 mg total) by mouth 2 (two) times daily.  180 tablet  3  . Multiple Vitamin (MULTIVITAMIN) tablet Take 1 tablet by mouth daily.        Marland Kitchen warfarin (COUMADIN) 2.5 MG tablet Take as directed by Anticoagulation clinic   45 tablet  3    No Known Allergies  History   Social History  . Marital Status: Married    Spouse Name: N/A    Number of Children: N/A  . Years of  Education: N/A   Occupational History  . Not on file.   Social History Main Topics  . Smoking status: Former Smoker    Quit date: 03/14/1977  . Smokeless tobacco: Never Used  . Alcohol Use: 1.5 oz/week    3 drink(s) per week  . Drug Use: No  . Sexually Active: Not on file   Other Topics Concern  . Not on file   Social History Narrative  . No narrative on file    Family History  Problem Relation Age of Onset  . Heart failure Mother   . Heart disease Brother   . Heart disease Brother     Review of Systems:  As stated in the HPI and otherwise negative.   BP 114/74  Pulse 69  Ht 6\' 2"  (1.88 m)  Wt 214 lb (97.07 kg)  BMI 27.48 kg/m2  Physical Examination: General: Well developed, well nourished, NAD HEENT: OP clear, mucus membranes moist SKIN: warm, dry. No rashes. Neuro: No focal deficits Musculoskeletal: Muscle strength 5/5 all ext Psychiatric: Mood and affect normal Neck: No JVD, no carotid bruits, no thyromegaly, no lymphadenopathy. Lungs:Clear bilaterally, no wheezes, rhonci, crackles Cardiovascular: Regular rate and rhythm with ectopy. No murmurs, gallops or rubs. Abdomen:Soft. Bowel  sounds present. Non-tender.  Extremities: No lower extremity edema. Pulses are 2 + in the bilateral DP/PT.  EKG: 10/12/10: NSR, rate 66 bpm. 1st degree AV block. PVC. Incomplete RBBB.   Echo 10/12/10:  Left ventricle: The cavity size was normal. Wall thickness was increased in a pattern of moderate LVH. Systolic function was normal. The estimated ejection fraction was in the range of 55% to 60%. Wall motion was normal; there were no regional wall motion abnormalities. Features are consistent with a pseudonormal left ventricular filling pattern, with concomitant abnormal relaxation and increased filling pressure (grade 2 diastolic dysfunction). - Aortic valve: Trivial regurgitation. - Mitral valve: Prior procedures included surgical repair. - Left atrium: The atrium was mildly  dilated. - Atrial septum: No defect or patent foramen ovale was identified.

## 2010-10-12 NOTE — Assessment & Plan Note (Signed)
Post-op atrial fibrillation. No recurrence. Will d/c coumadin. Continue metoprolol given his PVCs.

## 2010-10-25 ENCOUNTER — Telehealth: Payer: Self-pay | Admitting: Cardiovascular Disease

## 2010-10-25 DIAGNOSIS — I059 Rheumatic mitral valve disease, unspecified: Secondary | ICD-10-CM

## 2010-10-25 MED ORDER — AMOXICILLIN 500 MG PO CAPS: ORAL_CAPSULE | ORAL | Status: DC

## 2010-10-25 NOTE — Telephone Encounter (Signed)
Spoke with pt and told him he would need to take amoxicillin 2 gms ( 4 of the 500 mg capsules) one hour prior to dental work and that I would send prescription to his pharmacy.  He also states he is having back pain and asked if it is OK to take Tylenol and Tylenol PM.  I told him he could take as long as he followed dose instructions on label.  He will call Dr.Hensel regarding refill on Valium.

## 2010-10-25 NOTE — Telephone Encounter (Signed)
He will need antibiotic prophylaxis since there is prosthetic material used in the valve repair. I would recommend 2 grams of amoxicillin 1 hour before his dental work. cdm

## 2010-10-25 NOTE — Telephone Encounter (Signed)
Pt to have dental work on [employer].  Does he need pre meds?  If so, call [facility]  Please call pt and let him know.

## 2010-10-31 ENCOUNTER — Encounter: Payer: Self-pay | Admitting: *Deleted

## 2010-11-03 ENCOUNTER — Encounter: Payer: Self-pay | Admitting: Thoracic Surgery (Cardiothoracic Vascular Surgery)

## 2010-11-03 DIAGNOSIS — Z720 Tobacco use: Secondary | ICD-10-CM | POA: Insufficient documentation

## 2010-11-07 ENCOUNTER — Encounter: Payer: Self-pay | Admitting: Thoracic Surgery (Cardiothoracic Vascular Surgery)

## 2010-11-07 ENCOUNTER — Ambulatory Visit (INDEPENDENT_AMBULATORY_CARE_PROVIDER_SITE_OTHER): Payer: Self-pay | Admitting: Thoracic Surgery (Cardiothoracic Vascular Surgery)

## 2010-11-07 VITALS — BP 106/69 | HR 76 | Resp 18 | Ht 74.0 in | Wt 214.0 lb

## 2010-11-07 DIAGNOSIS — Z9889 Other specified postprocedural states: Secondary | ICD-10-CM

## 2010-11-07 NOTE — Progress Notes (Signed)
PCP is Sanjuana Letters, MD Referring Provider is Verne Carrow, *  Chief Complaint  Patient presents with  . Routine Post Op    3 month f/u, Mitral valve repair 06/14/10,  discuss Echo results    HPI: Patient returns for routine followup status post mitral valve repair on 06/14/2010. He was last seen here in our office on 07/04/2010. Since then he has done exceptionally well. He reports that he now feels much better than he did prior to surgery. He states that his exercise tolerance is considerably improved and he did not realize how tired he was feeling prior to surgery. He has no shortness of breath. He has no chest pain. Overall he feels remarkably well. He has no complaints.  Past Medical History  Diagnosis Date  . Mitral regurgitation   . Myxomatous degeneration of mitral valve   . Atrial fibrillation     Post-op. No recurrence.   . Tobacco abuse     Past Surgical History  Procedure Date  . Tonsillectomy   . Shoulder surgery   . Mitral valve repair 06/14/2010    right minithoracotomy for complex valvuloplasty with 30mm ring annuloplasty - Dr. Cornelius Moras    Family History  Problem Relation Age of Onset  . Heart failure Mother   . Heart disease Brother   . Heart disease Brother     Social History History  Substance Use Topics  . Smoking status: Former Smoker    Quit date: 03/14/1977  . Smokeless tobacco: Never Used  . Alcohol Use: 1.5 oz/week    3 drink(s) per week    Current Outpatient Prescriptions  Medication Sig Dispense Refill  . amoxicillin (AMOXIL) 500 MG capsule Take 4 capsules by mouth one hour prior to dental procedure.  4 capsule  0  . aspirin 81 MG tablet Take 81 mg by mouth daily.        . diazepam (VALIUM) 5 MG tablet Take 5 mg by mouth as needed.        . Glucosamine-Chondroit-Vit C-Mn (GLUCOSAMINE 1500 COMPLEX PO) Take 1,500 mg by mouth daily.        . metoprolol tartrate (LOPRESSOR) 25 MG tablet Take 1 tablet (25 mg total) by mouth 2  (two) times daily.  180 tablet  3  . Multiple Vitamin (MULTIVITAMIN) tablet Take 1 tablet by mouth daily.          No Known Allergies  Review of Systems: Otherwise unremarkable  BP 106/69  Pulse 76  Resp 18  Ht 6\' 2"  (1.88 m)  Wt 214 lb (97.07 kg)  BMI 27.48 kg/m2  SpO2 97% Physical Exam: On physical exam the patient looks quite good. There is no murmur on physical exam. Breath sounds are clear. The remainder of his physical exam is entirely unremarkable.  Diagnostic Tests: Echocardiogram performed 10/12/2010 is reviewed. The mitral valve repair appears perfectly intact. There is no mitral regurgitation. The mean gradient across the mitral valve is 5 mm mercury. Left ventricular function is normal. No other abnormalities are noted.  Impression: The patient is doing exceptionally well following mitral valve repair on 06/14/2010.  Plan: In the future the patient will call and return to see [place] as needed. All of his questions have been addressed.

## 2010-12-20 ENCOUNTER — Encounter: Payer: Self-pay | Admitting: Cardiovascular Disease

## 2010-12-22 ENCOUNTER — Telehealth: Payer: Self-pay | Admitting: Cardiovascular Disease

## 2010-12-22 NOTE — Telephone Encounter (Signed)
Called pt to see how he was doing and make sure he received message from Dr. Clifton James. Also need to know which pharmacy he uses. Left message to call back

## 2010-12-22 NOTE — Telephone Encounter (Signed)
Kenneth Mclaughlin has had multiple PVCs during exercise in cardiac rehab. I left him a message at home explaining his PvCs. Can we call him later  to see how he is feeling  and increase his Lopressor to 50 mg po BID? Thanks, chris

## 2010-12-23 ENCOUNTER — Telehealth: Payer: Self-pay | Admitting: Cardiovascular Disease

## 2010-12-23 DIAGNOSIS — I493 Ventricular premature depolarization: Secondary | ICD-10-CM

## 2010-12-23 NOTE — Telephone Encounter (Signed)
Fu call Returning pat's call from yesterday

## 2010-12-23 NOTE — Telephone Encounter (Signed)
Spoke with pt and went over new Lopressor 50mg  po BID dose.  Verified [facility] in [facility] as correct.  Waymon Budge, LPN.

## 2010-12-26 MED ORDER — METOPROLOL TARTRATE 50 MG PO TABS: 50.0000 mg | ORAL_TABLET | Freq: Two times a day (BID) | ORAL | Status: DC

## 2010-12-27 ENCOUNTER — Telehealth: Payer: Self-pay | Admitting: Cardiovascular Disease

## 2010-12-27 DIAGNOSIS — I493 Ventricular premature depolarization: Secondary | ICD-10-CM

## 2010-12-27 MED ORDER — METOPROLOL TARTRATE 25 MG PO TABS: 25.0000 mg | ORAL_TABLET | Freq: Two times a day (BID) | ORAL | Status: DC

## 2010-12-27 NOTE — Telephone Encounter (Signed)
Spoke with pt and gave him instructions from Dr. Clifton James to keep metoprolol at 25 mg twice daily. Will update prescription at [facility].

## 2010-12-27 NOTE — Telephone Encounter (Signed)
If his HR is 50, we should not increase his metoprolol. Thanks, cdm

## 2010-12-27 NOTE — Telephone Encounter (Signed)
Spoke with pt. He reports he has not increased metoprolol yet (still taking 25 mg twice daily) because his heart rate at rest is running 48-50. Blood pressure running around 122/62.  Will review with Dr. Clifton James.

## 2010-12-27 NOTE — Telephone Encounter (Signed)
New message: pts Metoprolol was increased and he has some questions regarding this medication change.  He would like to speak to Dr. Clifton James regarding this issue.

## 2011-04-21 ENCOUNTER — Encounter: Payer: Self-pay | Admitting: Family Medicine

## 2011-04-21 ENCOUNTER — Ambulatory Visit (INDEPENDENT_AMBULATORY_CARE_PROVIDER_SITE_OTHER): Payer: Medicare HMO | Admitting: Family Medicine

## 2011-04-21 VITALS — BP 126/83 | HR 64 | Ht 73.0 in | Wt 204.0 lb

## 2011-04-21 DIAGNOSIS — F411 Generalized anxiety disorder: Secondary | ICD-10-CM

## 2011-04-21 DIAGNOSIS — Z13220 Encounter for screening for lipoid disorders: Secondary | ICD-10-CM

## 2011-04-21 DIAGNOSIS — E785 Hyperlipidemia, unspecified: Secondary | ICD-10-CM | POA: Insufficient documentation

## 2011-04-21 DIAGNOSIS — F419 Anxiety disorder, unspecified: Secondary | ICD-10-CM

## 2011-04-21 DIAGNOSIS — E7800 Pure hypercholesterolemia, unspecified: Secondary | ICD-10-CM

## 2011-04-21 DIAGNOSIS — Z23 Encounter for immunization: Secondary | ICD-10-CM

## 2011-04-21 HISTORY — DX: Pure hypercholesterolemia, unspecified: E78.00

## 2011-04-21 LAB — LIPID PANEL
LDL Cholesterol: 122 mg/dL — ABNORMAL HIGH (ref 0–99)
VLDL: 23 mg/dL (ref 0–40)

## 2011-04-21 MED ORDER — CITALOPRAM HYDROBROMIDE 20 MG PO TABS: 20.0000 mg | ORAL_TABLET | Freq: Every day | ORAL | Status: DC

## 2011-04-21 MED ORDER — ZOSTER VACCINE LIVE 19400 UNT/0.65ML ~~LOC~~ SOLR: 0.6500 mL | Freq: Once | SUBCUTANEOUS | Status: AC

## 2011-04-21 NOTE — Progress Notes (Signed)
  Subjective:    Patient ID: Kenneth Mclaughlin, male    DOB: 26-Feb-1945, 66 y.o.   MRN: [id number]  HPI Feels worsening anxiety - actually it is a bit more irritability and not calmly handling stressful situations.  Slight decrease in sleep.  No helplessness, hopelessness or anhedonia.  No history of anxiety or depression.  Did have open heart surg less than one year ago for mitral valve replacement.    Physically feels great.  No complaints.    Review of Systems Chest pain, palpitations.     Objective:   Physical Exam Lungs clear Cardiac RRR without m or g        Assessment & Plan:

## 2011-04-21 NOTE — Assessment & Plan Note (Signed)
Check labs 

## 2011-04-21 NOTE — Patient Instructions (Signed)
I will send a prescription for the shingles vaccine.  Let me know if you get it. I recommend the pneumonia vaccine at age 66.  Both pneumonia and shingles are one time vaccines. Please do the hemocults - you will need to do these every year. I checked your cholesterol today.   I want to start you on a new med for anxiety.  The medicine will take a while to kick in 3-4 weeks would be typical.

## 2011-04-21 NOTE — Assessment & Plan Note (Signed)
Mild.  May be related to surgery.  Will try SSRI.

## 2011-04-24 ENCOUNTER — Encounter: Payer: Self-pay | Admitting: Family Medicine

## 2011-04-25 ENCOUNTER — Telehealth: Payer: Self-pay | Admitting: Family Medicine

## 2011-04-25 DIAGNOSIS — E7800 Pure hypercholesterolemia, unspecified: Secondary | ICD-10-CM

## 2011-04-25 NOTE — Telephone Encounter (Signed)
Patient is calling because he received a message from Dr. Leveda Anna for him to leave a good time for them to talk.  Mr. Kenneth Mclaughlin is available this morning up to noon and then again Friday morning up to noon.  I will also page Dr. Leveda Anna with this information

## 2011-04-28 MED ORDER — PRAVASTATIN SODIUM 40 MG PO TABS: 40.0000 mg | ORAL_TABLET | Freq: Every evening | ORAL | Status: DC

## 2011-04-28 NOTE — Assessment & Plan Note (Signed)
Start pravastatin and check d-LDL in 3 months.  Future order entered.

## 2011-07-11 ENCOUNTER — Ambulatory Visit: Payer: Medicare HMO | Admitting: Cardiovascular Disease

## 2011-08-14 ENCOUNTER — Other Ambulatory Visit: Payer: Self-pay | Admitting: Family Medicine

## 2011-08-22 IMAGING — CR DG CHEST 1V PORT
1 series · 1 of 1 positions shown · non-contrast
Comparison: 06/14/2010

CLINICAL DATA: Post mitral valve replacement

PORTABLE CHEST - 1 VIEW

[AP]
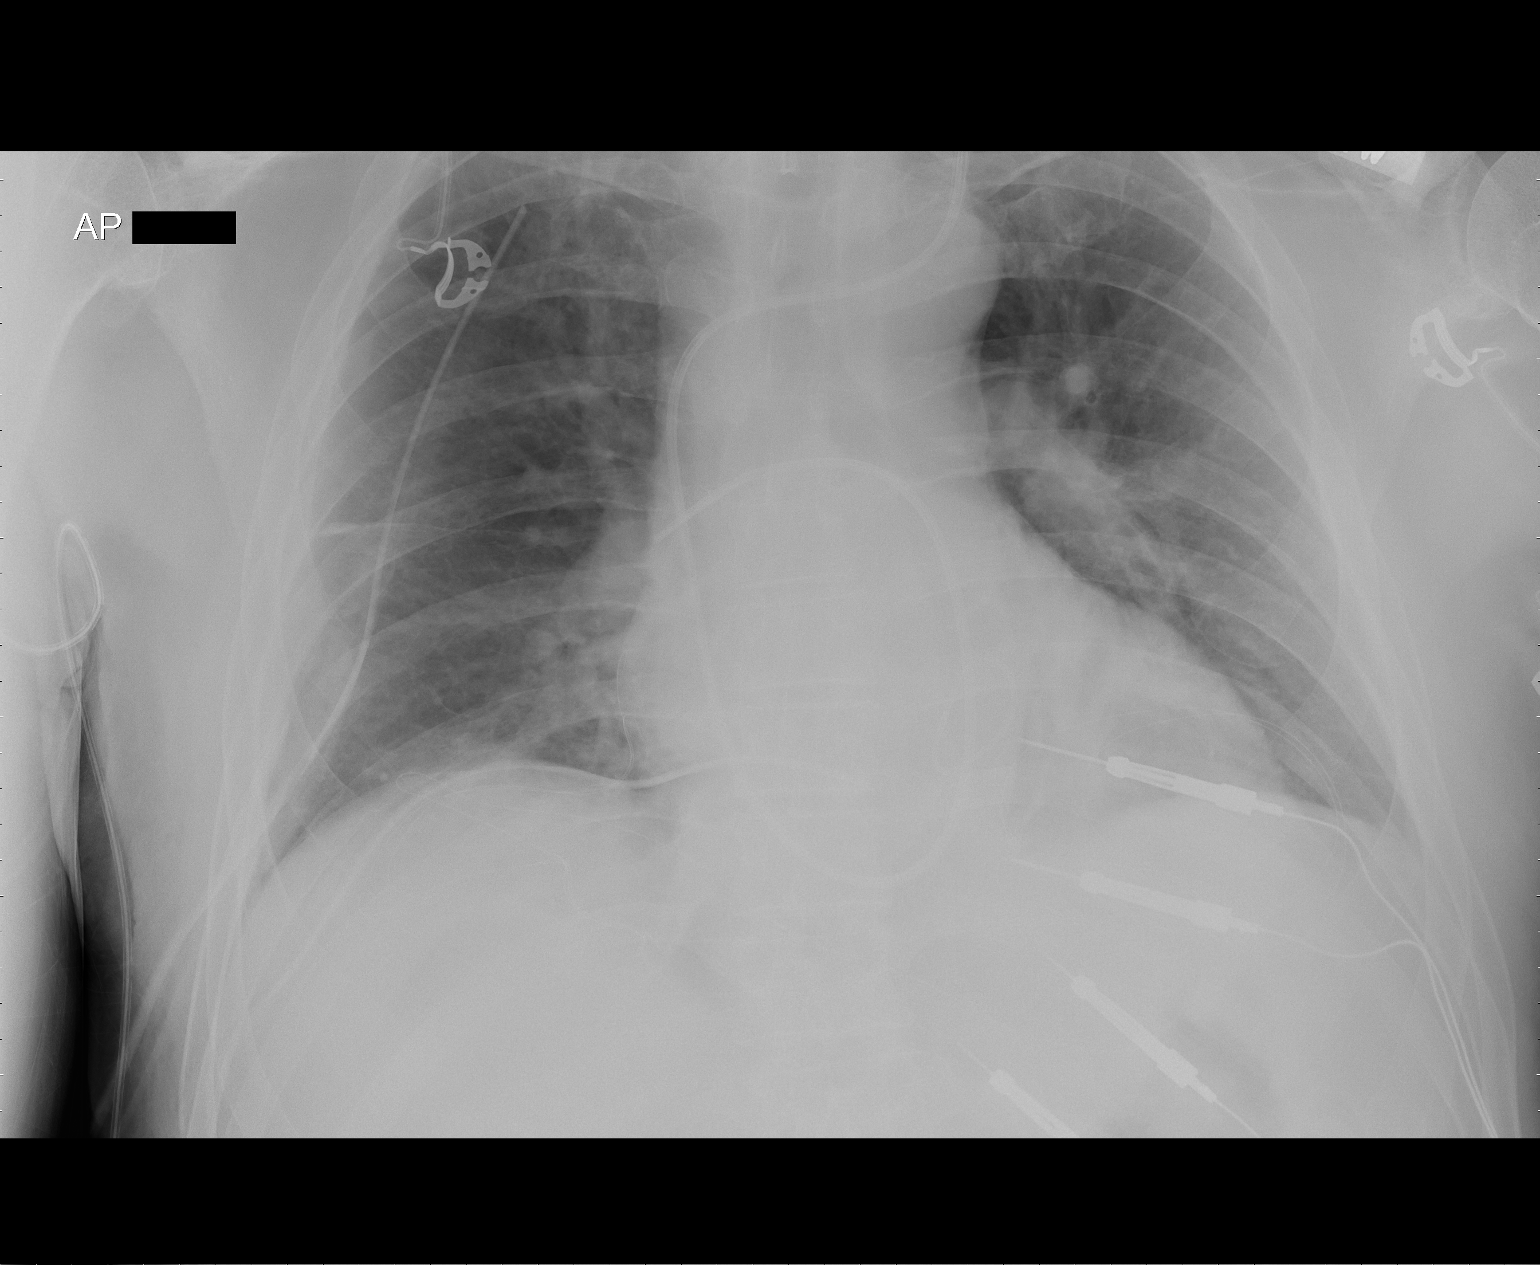

[1 of 1 positions shown; findings below may reference images not displayed]

FINDINGS: Interval decrease in vascular congestion.  Mild bibasilar
atelectasis.  No pneumothorax.  ET tube removed.  Other tubes and
lines unchanged.
IMPRESSION: 1.  Vascular congestion resolved.
2.  Satisfactory postoperative chest x-ray.

## 2011-08-23 IMAGING — CR DG CHEST 1V PORT
1 series · 1 of 1 positions shown · non-contrast
Comparison: 06/15/2010.

CLINICAL DATA: Post CABG.

PORTABLE CHEST - 1 VIEW

[view not recorded]
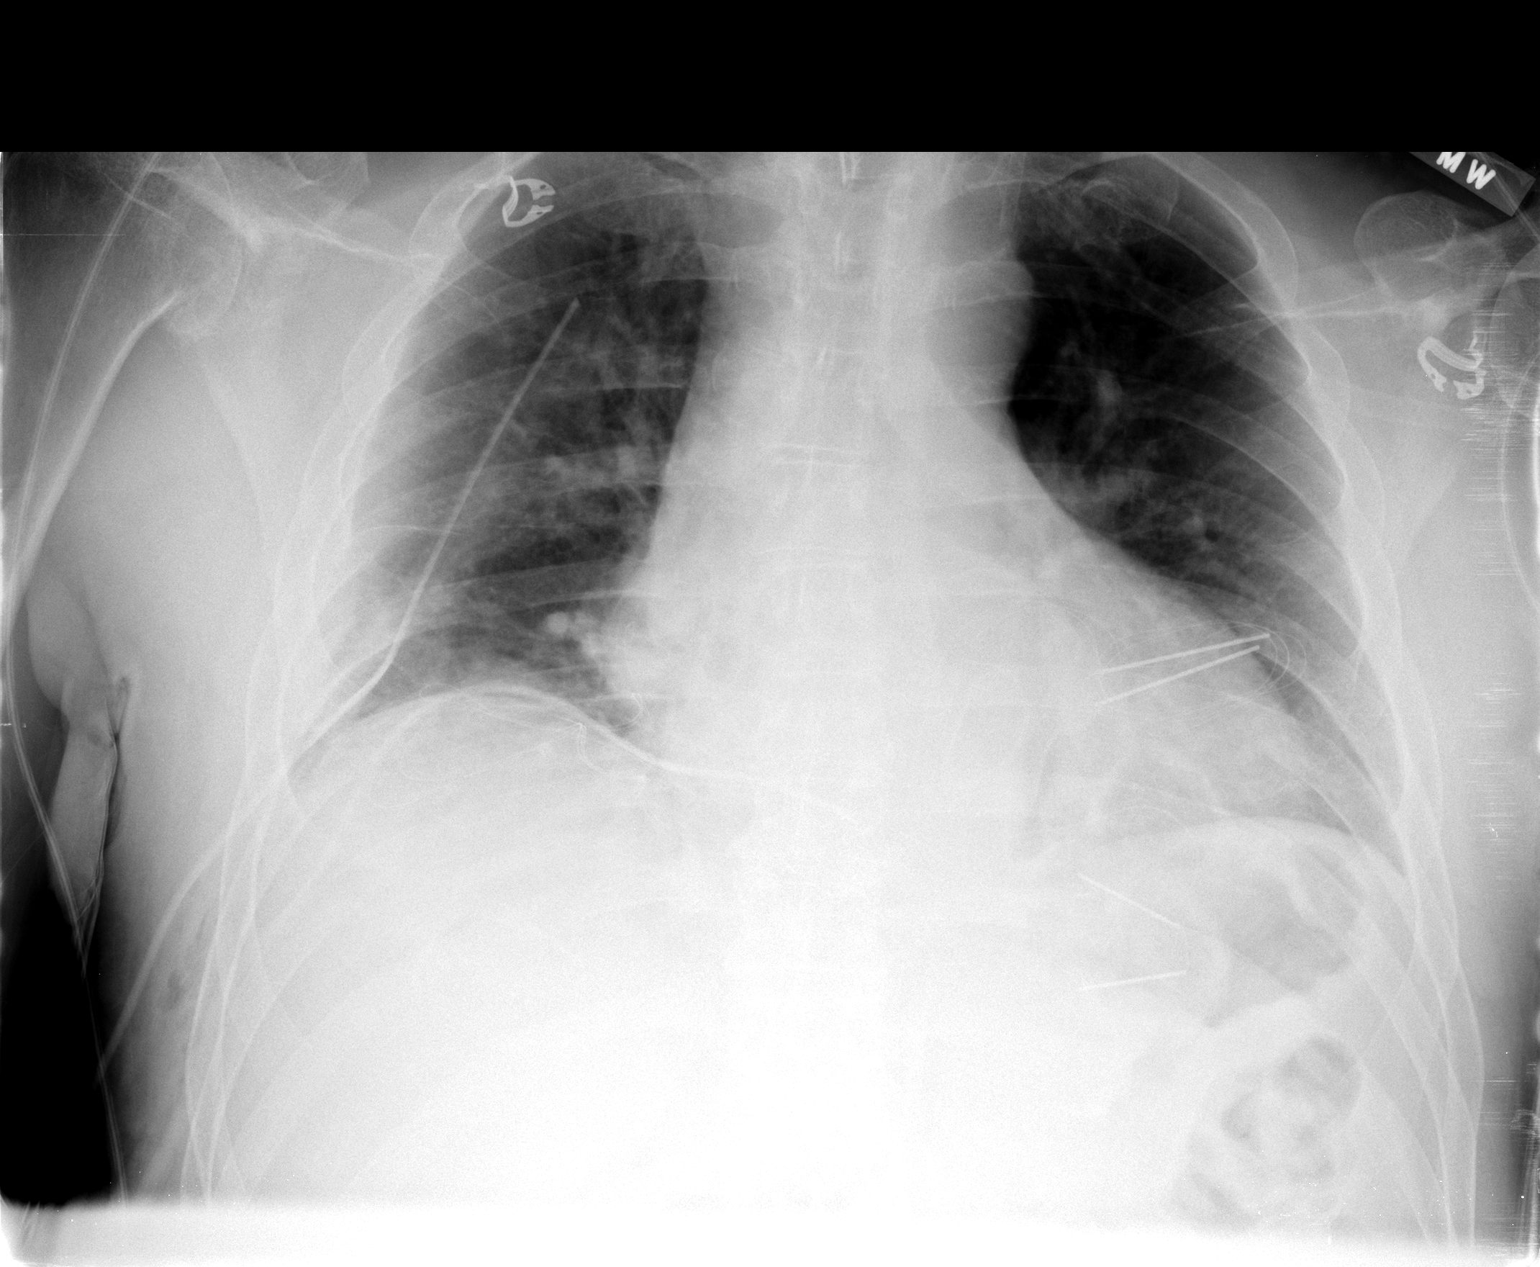

[1 of 1 positions shown; findings below may reference images not displayed]

FINDINGS: Swan-Ganz catheter has been removed.  Introducer remains
in place projecting over the left lung apex.  This may be within
the left jugular vein/innominate vein junction.

Cardiomegaly.  Epicardial leads in place.

Right-sided chest tubes are in place.  No pneumothorax.

Limited for detection of free intraperitoneal air.

Central pulmonary vessel prominence.  Basilar subsegmental
atelectasis.
IMPRESSION: Swan-Ganz catheter has been removed.  Otherwise no significant
change.

## 2011-08-24 ENCOUNTER — Ambulatory Visit (INDEPENDENT_AMBULATORY_CARE_PROVIDER_SITE_OTHER): Payer: Medicare HMO | Admitting: Cardiovascular Disease

## 2011-08-24 ENCOUNTER — Encounter: Payer: Self-pay | Admitting: Cardiovascular Disease

## 2011-08-24 VITALS — BP 110/76 | HR 68 | Ht 72.0 in | Wt 212.8 lb

## 2011-08-24 DIAGNOSIS — Z9889 Other specified postprocedural states: Secondary | ICD-10-CM

## 2011-08-24 DIAGNOSIS — I059 Rheumatic mitral valve disease, unspecified: Secondary | ICD-10-CM

## 2011-08-24 DIAGNOSIS — I340 Nonrheumatic mitral (valve) insufficiency: Secondary | ICD-10-CM

## 2011-08-24 NOTE — Patient Instructions (Addendum)
Your physician wants you to follow-up in:  12 months.  You will receive a reminder letter in the mail two months in advance. If you don't receive a letter, please call our office to schedule the follow-up appointment.   

## 2011-08-24 NOTE — Assessment & Plan Note (Signed)
Stable post MV repair. No changes. Will continue beta blocker for occasional PVCs.

## 2011-08-24 NOTE — Progress Notes (Signed)
History of Present Illness: 66 yo WM with history of severe MR here today for f/u. I saw him as a new pt 05/11/10 for evaluation of new systolic murmur and echo showed severe MR with likely flail leaflet. He underwent cardiac cath 05/23/10 which showed mild non-obstructive CAD and mild LV dysfunction with EF of 50%. TEE 05/13/10 with evidence of flail anterior mitral leaflet. He has undergone mitral valve repair per Dr. Cornelius Moras . He had atrial fibrillation post-op and was started on amiodarone and coumadin but both were stopped in September 2012. He has been maintained on metoprolol.  Echo September 2012 with normal LV systolic function, moderate LVH, appropriate appearance of repaired mitral valve.   He tells me today that he has been feeling well. No chest pain, SOB, dizziness, near syncope or syncope. He has not been aware of any irregularity of his heart rhythm. No palpitations. Energy level is better than it has been in over a year. He is working at [employer].   Primary Care Physician: None  Last Lipid Profile:  Lipid Panel     Component Value Date/Time   CHOL 185 04/21/2011 1208   TRIG 114 04/21/2011 1208   HDL 40 04/21/2011 1208   CHOLHDL 4.6 04/21/2011 1208   VLDL 23 04/21/2011 1208   LDLCALC 122* 04/21/2011 1208     Past Medical History  Diagnosis Date  . Mitral regurgitation   . Myxomatous degeneration of mitral valve   . Atrial fibrillation     Post-op. No recurrence.   . Tobacco abuse   . Hypercholesteremia 04/21/2011    Past Surgical History  Procedure Date  . Tonsillectomy   . Shoulder surgery   . Mitral valve repair 06/14/2010    right minithoracotomy for complex valvuloplasty with 30mm ring annuloplasty - Dr. Cornelius Moras    Current Outpatient Prescriptions  Medication Sig Dispense Refill  . amoxicillin (AMOXIL) 500 MG capsule Take 4 capsules by mouth one hour prior to dental procedure.  4 capsule  0  . aspirin 81 MG tablet Take 81 mg by mouth daily.        . citalopram (CELEXA)  20 MG tablet TAKE ONE TABLET BY MOUTH ONE TIME DAILY  30 tablet  2  . Glucosamine-Chondroit-Vit C-Mn (GLUCOSAMINE 1500 COMPLEX PO) Take 1,500 mg by mouth daily.        . metoprolol (LOPRESSOR) 25 MG tablet Take 1 tablet (25 mg total) by mouth 2 (two) times daily.  180 tablet  3  . Multiple Vitamin (MULTIVITAMIN) tablet Take 1 tablet by mouth daily.        . pravastatin (PRAVACHOL) 40 MG tablet Take 1 tablet (40 mg total) by mouth every evening.  90 tablet  3    No Known Allergies  History   Social History  . Marital Status: Married    Spouse Name: N/A    Number of Children: N/A  . Years of Education: N/A   Occupational History  . Not on file.   Social History Main Topics  . Smoking status: Former Smoker    Quit date: 03/14/1977  . Smokeless tobacco: Never Used  . Alcohol Use: 1.5 oz/week    3 drink(s) per week  . Drug Use: No  . Sexually Active: Not on file   Other Topics Concern  . Not on file   Social History Narrative  . No narrative on file    Family History  Problem Relation Age of Onset  . Heart failure Mother   .  Heart disease Brother   . Heart disease Brother     Review of Systems:  As stated in the HPI and otherwise negative.   BP 110/76  Pulse 68  Ht 6' (1.829 m)  Wt 212 lb 12.8 oz (96.525 kg)  BMI 28.86 kg/m2  Physical Examination: General: Well developed, well nourished, NAD HEENT: OP clear, mucus membranes moist SKIN: warm, dry. No rashes. Neuro: No focal deficits Musculoskeletal: Muscle strength 5/5 all ext Psychiatric: Mood and affect normal Neck: No JVD, no carotid bruits, no thyromegaly, no lymphadenopathy. Lungs:Clear bilaterally, no wheezes, rhonci, crackles Cardiovascular: Regular rate and rhythm. No murmurs, gallops or rubs. Abdomen:Soft. Bowel sounds present. Non-tender.  Extremities: No lower extremity edema. Pulses are 2 + in the bilateral DP/PT.  EKG: NSR, rate 68 bpm. PVCs. LAFB

## 2011-12-12 ENCOUNTER — Other Ambulatory Visit: Payer: Self-pay | Admitting: Family Medicine

## 2012-02-12 ENCOUNTER — Other Ambulatory Visit: Payer: Self-pay | Admitting: Cardiovascular Disease

## 2012-03-24 ENCOUNTER — Other Ambulatory Visit: Payer: Self-pay | Admitting: Family Medicine

## 2012-06-19 ENCOUNTER — Other Ambulatory Visit: Payer: Self-pay | Admitting: Family Medicine

## 2012-06-20 ENCOUNTER — Other Ambulatory Visit: Payer: Self-pay | Admitting: Family Medicine

## 2012-08-30 ENCOUNTER — Encounter: Payer: Self-pay | Admitting: Cardiovascular Disease

## 2012-08-30 ENCOUNTER — Ambulatory Visit (INDEPENDENT_AMBULATORY_CARE_PROVIDER_SITE_OTHER): Payer: Medicare Other | Admitting: Cardiovascular Disease

## 2012-08-30 ENCOUNTER — Telehealth: Payer: Self-pay | Admitting: Family Medicine

## 2012-08-30 VITALS — BP 116/79 | HR 68 | Ht 72.0 in | Wt 226.8 lb

## 2012-08-30 DIAGNOSIS — Z9889 Other specified postprocedural states: Secondary | ICD-10-CM

## 2012-08-30 DIAGNOSIS — I059 Rheumatic mitral valve disease, unspecified: Secondary | ICD-10-CM

## 2012-08-30 DIAGNOSIS — I4891 Unspecified atrial fibrillation: Secondary | ICD-10-CM

## 2012-08-30 DIAGNOSIS — I480 Paroxysmal atrial fibrillation: Secondary | ICD-10-CM

## 2012-08-30 DIAGNOSIS — I340 Nonrheumatic mitral (valve) insufficiency: Secondary | ICD-10-CM

## 2012-08-30 NOTE — Progress Notes (Signed)
History of Present Illness: 67 yo WM with history of severe MR s/p mitral valve repair here today for f/u. I saw him as a new pt 05/11/10 for evaluation of new systolic murmur and echo showed severe MR with likely flail leaflet. He underwent cardiac cath 05/23/10 which showed mild non-obstructive CAD and mild LV dysfunction with EF of 50%. TEE 05/13/10 with evidence of flail anterior mitral leaflet. He has undergone mitral valve repair per Dr. Cornelius Moras May 2012. He had atrial fibrillation post-op and was started on amiodarone and coumadin but both were stopped in September 2012. He has been maintained on metoprolol. Echo September 2012 with normal LV systolic function, moderate LVH, appropriate appearance of repaired mitral valve.   He is here today for follow up. No chest pain, SOB, dizziness, near syncope or syncope. He has not been aware of any irregularity of his heart rhythm. No palpitations.   Primary Care Physician: None  Last Lipid Profile:Lipid Panel     Component Value Date/Time   CHOL 185 04/21/2011 1208   TRIG 114 04/21/2011 1208   HDL 40 04/21/2011 1208   CHOLHDL 4.6 04/21/2011 1208   VLDL 23 04/21/2011 1208   LDLCALC 122* 04/21/2011 1208     Past Medical History  Diagnosis Date  . Mitral regurgitation   . Myxomatous degeneration of mitral valve   . Atrial fibrillation     Post-op. No recurrence.   . Tobacco abuse   . Hypercholesteremia 04/21/2011    Past Surgical History  Procedure Laterality Date  . Tonsillectomy    . Shoulder surgery    . Mitral valve repair  06/14/2010    right minithoracotomy for complex valvuloplasty with 30mm ring annuloplasty - Dr. Cornelius Moras    Current Outpatient Prescriptions  Medication Sig Dispense Refill  . amoxicillin (AMOXIL) 500 MG capsule Take 4 capsules by mouth one hour prior to dental procedure.  4 capsule  0  . aspirin 81 MG tablet Take 81 mg by mouth daily.        . citalopram (CELEXA) 20 MG tablet TAKE ONE TABLET BY MOUTH ONE TIME DAILY  90 tablet   0  . Glucosamine-Chondroit-Vit C-Mn (GLUCOSAMINE 1500 COMPLEX PO) Take 1,500 mg by mouth daily.        . metoprolol tartrate (LOPRESSOR) 25 MG tablet TAKE ONE TABLET BY MOUTH TWICE DAILY  180 tablet  2  . Multiple Vitamin (MULTIVITAMIN) tablet Take 1 tablet by mouth daily.        . pravastatin (PRAVACHOL) 40 MG tablet TAKE ONE TABLET BY MOUTH DAILY IN THE EVENING  90 tablet  0   No current facility-administered medications for this visit.    No Known Allergies  History   Social History  . Marital Status: Married    Spouse Name: N/A    Number of Children: N/A  . Years of Education: N/A   Occupational History  . Not on file.   Social History Main Topics  . Smoking status: Former Smoker    Quit date: 03/14/1977  . Smokeless tobacco: Never Used  . Alcohol Use: 1.5 oz/week    3 drink(s) per week  . Drug Use: No  . Sexual Activity: Not on file   Other Topics Concern  . Not on file   Social History Narrative  . No narrative on file    Family History  Problem Relation Age of Onset  . Heart failure Mother   . Heart disease Brother   . Heart disease Brother  Review of Systems:  As stated in the HPI and otherwise negative.   BP 116/79  Pulse 68  Ht 6' (1.829 m)  Wt 226 lb 12.8 oz (102.876 kg)  BMI 30.75 kg/m2  Physical Examination: General: Well developed, well nourished, NAD HEENT: OP clear, mucus membranes moist SKIN: warm, dry. No rashes. Neuro: No focal deficits Musculoskeletal: Muscle strength 5/5 all ext Psychiatric: Mood and affect normal Neck: No JVD, no carotid bruits, no thyromegaly, no lymphadenopathy. Lungs:Clear bilaterally, no wheezes, rhonci, crackles Cardiovascular: Regular rate and rhythm. No murmurs, gallops or rubs. Abdomen:Soft. Bowel sounds present. Non-tender.  Extremities: No lower extremity edema. Pulses are 2 + in the bilateral DP/PT.  EKG: Sinus rhythm, rate 67 bpm. 1st degree AV block.   Assessment and Plan:   1. S/P mitral  valve repair:  Stable post MV repair. No changes. Will continue beta blocker for occasional PVCs.      2. Paroxysmal atrial fibrillation: Occurred post-op in 2012 and no occurrence since. Sinus today. Continue beta blocker and ASA.

## 2012-08-30 NOTE — Patient Instructions (Addendum)
Your physician wants you to follow-up in:  12 months.  You will receive a reminder letter in the mail two months in advance. If you don't receive a letter, please call our office to schedule the follow-up appointment.   

## 2012-08-30 NOTE — Telephone Encounter (Signed)
Kenneth Mclaughlin has changed pharmacy and now need his rxs to go to [facility] on [address] in [place].  At this moment his rx for Citalopram has expired and he need a refill.  He has an appt on 9/5 @ 8:30.  Please inform when rx has been sent.

## 2012-09-02 MED ORDER — CITALOPRAM HYDROBROMIDE 20 MG PO TABS: ORAL_TABLET | ORAL | Status: DC

## 2012-09-02 NOTE — Telephone Encounter (Signed)
Done

## 2012-09-20 ENCOUNTER — Ambulatory Visit: Payer: Medicare Other | Admitting: Family Medicine

## 2012-10-02 ENCOUNTER — Ambulatory Visit (INDEPENDENT_AMBULATORY_CARE_PROVIDER_SITE_OTHER): Payer: Medicare Other | Admitting: Family Medicine

## 2012-10-02 ENCOUNTER — Encounter: Payer: Self-pay | Admitting: Family Medicine

## 2012-10-02 VITALS — BP 121/70 | HR 65 | Ht 72.0 in | Wt 228.0 lb

## 2012-10-02 DIAGNOSIS — M545 Low back pain: Secondary | ICD-10-CM | POA: Insufficient documentation

## 2012-10-02 DIAGNOSIS — Z23 Encounter for immunization: Secondary | ICD-10-CM

## 2012-10-02 DIAGNOSIS — E7800 Pure hypercholesterolemia, unspecified: Secondary | ICD-10-CM

## 2012-10-02 DIAGNOSIS — F419 Anxiety disorder, unspecified: Secondary | ICD-10-CM

## 2012-10-02 DIAGNOSIS — F411 Generalized anxiety disorder: Secondary | ICD-10-CM

## 2012-10-02 DIAGNOSIS — Z0000 Encounter for general adult medical examination without abnormal findings: Secondary | ICD-10-CM

## 2012-10-02 LAB — COMPLETE METABOLIC PANEL WITH GFR
Albumin: 4.4 g/dL (ref 3.5–5.2)
BUN: 12 mg/dL (ref 6–23)
CO2: 28 mEq/L (ref 19–32)
Calcium: 9.5 mg/dL (ref 8.4–10.5)
Chloride: 101 mEq/L (ref 96–112)
Creat: 0.76 mg/dL (ref 0.50–1.35)
GFR, Est African American: >89 mL/min
Glucose, Bld: 77 mg/dL (ref 70–99)

## 2012-10-02 LAB — LIPID PANEL
Cholesterol: 185 mg/dL (ref 0–200)
Triglycerides: 209 mg/dL — ABNORMAL HIGH (ref <150)

## 2012-10-02 MED ORDER — PRAVASTATIN SODIUM 40 MG PO TABS: 40.0000 mg | ORAL_TABLET | Freq: Every day | ORAL | Status: DC

## 2012-10-02 MED ORDER — ZOSTER VACCINE LIVE 19400 UNT/0.65ML ~~LOC~~ SOLR: 0.6500 mL | Freq: Once | SUBCUTANEOUS | Status: DC

## 2012-10-02 MED ORDER — DIAZEPAM 5 MG PO TABS: 5.0000 mg | ORAL_TABLET | Freq: Four times a day (QID) | ORAL | Status: DC | PRN

## 2012-10-02 NOTE — Patient Instructions (Addendum)
I will call the blood work results. You got a flu shot and a pneumonia vaccine today. I set a prescription for the shingles vaccine.  Let me know if you get it for my records. Please complete the poop cards (hemocults) and mail or bring back in.   It would be good to lose down to around 200 lbs.

## 2012-10-02 NOTE — Assessment & Plan Note (Signed)
Last valium Rx lasted for 6 years.

## 2012-10-03 ENCOUNTER — Encounter: Payer: Self-pay | Admitting: Family Medicine

## 2012-10-03 DIAGNOSIS — Z0000 Encounter for general adult medical examination without abnormal findings: Secondary | ICD-10-CM | POA: Insufficient documentation

## 2012-10-03 NOTE — Assessment & Plan Note (Signed)
Stable on celexa, refill

## 2012-10-03 NOTE — Assessment & Plan Note (Signed)
Healthy male.  Needs immunizations and wt loss

## 2012-10-03 NOTE — Assessment & Plan Note (Signed)
No problem, check labs. Update, could consider switch to atorvastatin, but I will focus on wt loss and exercise to raise HDL

## 2012-10-03 NOTE — Progress Notes (Signed)
  Subjective:    Patient ID: Kenneth Mclaughlin, male    DOB: September 02, 1945, 67 y.o.   MRN: [id number]  HPI  In for annual exam and follow up of issues.  He feels great.  Good energy, no complaints. He admits to letting his weight creep up and to not exercising sufficiently.  He is now working.  On his feet a lot.  Finances still tight. HPDP:  Due for Flu, pneumonia and shingles vaccines.  Will give.  Also due for colon cancer screen:  For finanacial reasons, will screen with hemocults per patient request. Chronic problems Intermittent back pain/spasms.  Not progressive.  Would like refill on diazepam - last Rx lasted 6 years so takes very rarely Anxiety, well controled.  Wants to remain on citalopram Hypercholesterol due for labs. Mitral valve repair.  No concerns, followed by cards.    Review of Systems Denies CP, SOB, syncope, HA, changes in bowel, bladder, appetite or weight.  No bleeding     Objective:   Physical ExamHEENT nl Neck supple Lungs clear Cardiac RRR without m or g Abd benign Ext no edema Neuro grossly intact.        Assessment & Plan:

## 2012-11-30 ENCOUNTER — Other Ambulatory Visit: Payer: Self-pay | Admitting: Cardiovascular Disease

## 2012-11-30 ENCOUNTER — Other Ambulatory Visit: Payer: Self-pay | Admitting: Family Medicine

## 2013-08-29 ENCOUNTER — Other Ambulatory Visit: Payer: Self-pay | Admitting: Cardiovascular Disease

## 2013-10-02 ENCOUNTER — Other Ambulatory Visit: Payer: Self-pay | Admitting: Cardiovascular Disease

## 2013-11-08 ENCOUNTER — Other Ambulatory Visit: Payer: Self-pay | Admitting: Family Medicine

## 2013-12-03 ENCOUNTER — Other Ambulatory Visit: Payer: Self-pay | Admitting: Family Medicine

## 2013-12-03 DIAGNOSIS — F419 Anxiety disorder, unspecified: Secondary | ICD-10-CM

## 2013-12-03 NOTE — Assessment & Plan Note (Signed)
Refill per e request 

## 2013-12-25 ENCOUNTER — Encounter: Payer: Self-pay | Admitting: Cardiovascular Disease

## 2013-12-25 ENCOUNTER — Ambulatory Visit (INDEPENDENT_AMBULATORY_CARE_PROVIDER_SITE_OTHER): Payer: Commercial Managed Care - HMO | Admitting: Cardiovascular Disease

## 2013-12-25 VITALS — BP 122/74 | HR 66 | Ht 73.0 in | Wt 231.1 lb

## 2013-12-25 DIAGNOSIS — Z9889 Other specified postprocedural states: Secondary | ICD-10-CM

## 2013-12-25 DIAGNOSIS — I340 Nonrheumatic mitral (valve) insufficiency: Secondary | ICD-10-CM

## 2013-12-25 DIAGNOSIS — I480 Paroxysmal atrial fibrillation: Secondary | ICD-10-CM

## 2013-12-25 NOTE — Progress Notes (Signed)
History of Present Illness: 68 yo WM with history of severe MR s/p mitral valve repair here today for f/u.  I saw him as a new pt 05/11/10 for evaluation of new systolic murmur and echo showed severe MR with likely flail leaflet. He underwent cardiac cath 05/23/10 which showed mild non-obstructive CAD and mild LV dysfunction with EF of 50%. TEE 05/13/10 with evidence of flail anterior mitral leaflet. He has undergone mitral valve repair per Dr. Roxy Manns May 2012. He had atrial fibrillation post-op and was started on amiodarone and coumadin but both were stopped in September 2012. He has been maintained on metoprolol and appears to have maintained sinus rhythm over the past few years. Echo September 2012 with normal LV systolic function, moderate LVH, appropriate appearance of repaired mitral valve.   He is here today for follow up. No chest pain, SOB, dizziness, near syncope or syncope. He has not been aware of any irregularity of his heart rhythm. No palpitations.   Primary Care Physician: None  Last Lipid Profile:Lipid Panel     Component Value Date/Time   CHOL 185 10/02/2012 1521   TRIG 209* 10/02/2012 1521   HDL 34* 10/02/2012 1521   CHOLHDL 5.4 10/02/2012 1521   VLDL 42* 10/02/2012 1521   LDLCALC 109* 10/02/2012 1521     Past Medical History  Diagnosis Date  . Mitral regurgitation   . Myxomatous degeneration of mitral valve   . Atrial fibrillation     Post-op. No recurrence.   . Tobacco abuse   . Hypercholesteremia 04/21/2011    Past Surgical History  Procedure Laterality Date  . Tonsillectomy    . Shoulder surgery    . Mitral valve repair  06/14/2010    right minithoracotomy for complex valvuloplasty with 77mm ring annuloplasty - Dr. Roxy Manns    Current Outpatient Prescriptions  Medication Sig Dispense Refill  . aspirin 81 MG tablet Take 81 mg by mouth daily.      . citalopram (CELEXA) 20 MG tablet TAKE 1 TABLET BY MOUTH EVERY DAY 90 tablet 3  . Glucosamine-Chondroit-Vit C-Mn  (GLUCOSAMINE 1500 COMPLEX PO) Take 1,500 mg by mouth daily.      . metoprolol tartrate (LOPRESSOR) 25 MG tablet TAKE 1 TABLET BY MOUTH TWICE A DAY 180 tablet 0  . Multiple Vitamin (MULTIVITAMIN) tablet Take 1 tablet by mouth daily.      . pravastatin (PRAVACHOL) 40 MG tablet TAKE 1 TABLET BY MOUTH EVERY DAY 90 tablet 3  . amoxicillin (AMOXIL) 500 MG capsule Take 4 capsules by mouth one hour prior to dental procedure. (Patient not taking: Reported on 12/25/2013) 4 capsule 0   No current facility-administered medications for this visit.    No Known Allergies  History   Social History  . Marital Status: Married    Spouse Name: N/A    Number of Children: N/A  . Years of Education: N/A   Occupational History  . Not on file.   Social History Main Topics  . Smoking status: Former Smoker    Quit date: 03/14/1977  . Smokeless tobacco: Never Used  . Alcohol Use: 1.5 oz/week    3 drink(s) per week  . Drug Use: No  . Sexual Activity: Not on file   Other Topics Concern  . Not on file   Social History Narrative    Family History  Problem Relation Age of Onset  . Heart failure Mother   . Heart disease Brother   . Heart disease Brother  Review of Systems:  As stated in the HPI and otherwise negative.   BP 122/74 mmHg  Pulse 66  Ht 6\' 1"  (1.854 m)  Wt 231 lb 1.9 oz (104.835 kg)  BMI 30.50 kg/m2  Physical Examination: General: Well developed, well nourished, NAD HEENT: OP clear, mucus membranes moist SKIN: warm, dry. No rashes. Neuro: No focal deficits Musculoskeletal: Muscle strength 5/5 all ext Psychiatric: Mood and affect normal Neck: No JVD, no carotid bruits, no thyromegaly, no lymphadenopathy. Lungs:Clear bilaterally, no wheezes, rhonci, crackles Cardiovascular: Regular rate and rhythm. No murmurs, gallops or rubs. Abdomen:Soft. Bowel sounds present. Non-tender.  Extremities: No lower extremity edema. Pulses are 2 + in the bilateral DP/PT.  EKG: Sinus, 1st  degree AV block with PVCs.   Assessment and Plan:   1. S/P mitral valve repair:  Stable post MV repair. No changes. Will continue beta blocker for occasional PVCs. Will repeat echo in one year.    2. Paroxysmal atrial fibrillation: Occurred post-op in 2012 and no occurrence since. Sinus today. Continue beta blocker and ASA.

## 2013-12-25 NOTE — Patient Instructions (Signed)
Your physician wants you to follow-up in:  12 months.  You will receive a reminder letter in the mail two months in advance. If you don't receive a letter, please call our office to schedule the follow-up appointment.   

## 2013-12-28 ENCOUNTER — Other Ambulatory Visit: Payer: Self-pay | Admitting: Cardiovascular Disease

## 2014-01-01 ENCOUNTER — Telehealth: Payer: Self-pay | Admitting: Cardiovascular Disease

## 2014-01-01 NOTE — Telephone Encounter (Signed)
Spoke with pt and gave him information from Dr. Angelena Form. Pt made aware he will need amoxicillin 2 gm to be taken 60 minutes prior to dental visit. He will make dentist aware. Pt will have dentist write for this.  I told pt our office could also order if needed.

## 2014-01-01 NOTE — Telephone Encounter (Signed)
I placed call to pt to give him information from Dr. Angelena Form. Left message to call back

## 2014-01-01 NOTE — Telephone Encounter (Signed)
Pt will need antibiotic prophylaxis before dental visits. cdm

## 2014-08-24 ENCOUNTER — Other Ambulatory Visit: Payer: Self-pay | Admitting: Family Medicine

## 2014-08-25 NOTE — Telephone Encounter (Signed)
Denied valium for muscle spasm.  I can't see that I have ever prescribed and he is overdue for an appointment.

## 2014-09-16 ENCOUNTER — Encounter: Payer: Self-pay | Admitting: Family Medicine

## 2014-09-16 ENCOUNTER — Ambulatory Visit (INDEPENDENT_AMBULATORY_CARE_PROVIDER_SITE_OTHER): Payer: PPO | Admitting: Family Medicine

## 2014-09-16 VITALS — BP 124/75 | HR 66 | Temp 98.1°F | Ht 73.0 in | Wt 235.5 lb

## 2014-09-16 DIAGNOSIS — E7800 Pure hypercholesterolemia, unspecified: Secondary | ICD-10-CM

## 2014-09-16 DIAGNOSIS — Z1159 Encounter for screening for other viral diseases: Secondary | ICD-10-CM | POA: Diagnosis not present

## 2014-09-16 DIAGNOSIS — M545 Low back pain, unspecified: Secondary | ICD-10-CM

## 2014-09-16 DIAGNOSIS — Z1211 Encounter for screening for malignant neoplasm of colon: Secondary | ICD-10-CM | POA: Insufficient documentation

## 2014-09-16 DIAGNOSIS — Z23 Encounter for immunization: Secondary | ICD-10-CM

## 2014-09-16 DIAGNOSIS — Z0000 Encounter for general adult medical examination without abnormal findings: Secondary | ICD-10-CM

## 2014-09-16 DIAGNOSIS — F419 Anxiety disorder, unspecified: Secondary | ICD-10-CM

## 2014-09-16 DIAGNOSIS — E785 Hyperlipidemia, unspecified: Secondary | ICD-10-CM

## 2014-09-16 LAB — COMPREHENSIVE METABOLIC PANEL
ALBUMIN: 4.8 g/dL (ref 3.6–5.1)
ALT: 18 U/L (ref 9–46)
AST: 17 U/L (ref 10–35)
Alkaline Phosphatase: 65 U/L (ref 40–115)
BUN: 11 mg/dL (ref 7–25)
CALCIUM: 10 mg/dL (ref 8.6–10.3)
CO2: 28 mmol/L (ref 20–31)
Chloride: 101 mmol/L (ref 98–110)
Creat: 0.76 mg/dL (ref 0.70–1.25)
Glucose, Bld: 96 mg/dL (ref 65–99)
POTASSIUM: 4.2 mmol/L (ref 3.5–5.3)
Sodium: 138 mmol/L (ref 135–146)
TOTAL PROTEIN: 6.7 g/dL (ref 6.1–8.1)
Total Bilirubin: 0.6 mg/dL (ref 0.2–1.2)

## 2014-09-16 LAB — LIPID PANEL
Cholesterol: 185 mg/dL (ref 125–200)
HDL: 29 mg/dL — ABNORMAL LOW (ref >=40)
TRIGLYCERIDES: 523 mg/dL — AB (ref <150)
Total CHOL/HDL Ratio: 6.4 Ratio — ABNORMAL HIGH (ref <=5.0)

## 2014-09-16 MED ORDER — ZOSTER VACCINE LIVE 19400 UNT/0.65ML ~~LOC~~ SOLR: 0.6500 mL | Freq: Once | SUBCUTANEOUS | Status: DC

## 2014-09-16 MED ORDER — DIAZEPAM 5 MG PO TABS: 5.0000 mg | ORAL_TABLET | Freq: Two times a day (BID) | ORAL | Status: DC | PRN

## 2014-09-16 NOTE — Patient Instructions (Signed)
Today you will get a flu shot and your first pneumonia vaccine. I will call with blood test results. Send in your poop cards to check for colon cancer. I sent in a prescription for the shingles vaccine.  I hope your new insurance covers it. I am glad you are doing well. OK to start exercising again.  Go slow.

## 2014-09-17 ENCOUNTER — Encounter: Payer: Self-pay | Admitting: Family Medicine

## 2014-09-17 LAB — HEPATITIS C ANTIBODY: HCV AB: NEGATIVE

## 2014-09-17 NOTE — Assessment & Plan Note (Signed)
Generally healthy.  No at risk behaviors.  No visit since age 69.  Catch up on immunizations.

## 2014-09-17 NOTE — Progress Notes (Signed)
   Subjective:    Patient ID: Kenneth Mclaughlin, male    DOB: Jan 24, 1945, 69 y.o.   MRN: [id number]  HPI Annual exam He is doing generally great.  Very few complaints.  Here are the issues. 1. S/P mitral valve repair.  Does not need anticoag (beyond daily ASA for CAD prophylaxis)  Asymptomatic. 2. Minimal CAD based on cath.  As such goal LDL is <100  Now taking pravastatin every other day due to muscle soreness. 3. Prefers no colonoscopy.  Discussed options. He will do hemocults.   4. Needs both flu and pneumonia vaccination. 5. Did not get shingles vaccine yet due to cost.  Has new insurance.  Will resend Rx to see if it is covered. 6. Has not been exercising.  Wants to restart jogging.  Will need to be careful because of bad knees.   7. Has not yet been screened for HIV or Hep C. No apparent risk factors.   No at risk behaviors.  Denies drug, alcohol and tobacco use.  8. Depression, well controled.  Remains on celexa.    Review of SystemsNo weight, appetite or bowel change.  Denies SOB, chest pain and leg swelling.  No bleeding.  No difficulty urinating.  No weakness or headache.  Does have muscle aches which improved greatly with decreasing statin dose.  Bilateral knee pain.     Objective:   Physical ExamHEENT normal Neck supple Lungs clear Cardiac RRR without m or g Abd benign Ext wnl Neuron wnl        Assessment & Plan:

## 2014-09-17 NOTE — Assessment & Plan Note (Signed)
Hemocults based on patient preference.

## 2014-09-17 NOTE — Assessment & Plan Note (Signed)
Well controled on current meds 

## 2014-09-17 NOTE — Assessment & Plan Note (Signed)
Unfortunately, non fasting specimen.  Will treat high triglycerides with diet and exercise for now.  Recheck fasting in 2-3 months.  Also check TSH.  DM unlikely with normal BS. Likely LDL is OK based on formula (admittedly inaccurate with Tri>400) Check direct LDL.

## 2014-12-23 ENCOUNTER — Other Ambulatory Visit: Payer: Self-pay | Admitting: Cardiovascular Disease

## 2015-01-16 ENCOUNTER — Other Ambulatory Visit: Payer: Self-pay | Admitting: Family Medicine

## 2015-02-09 ENCOUNTER — Other Ambulatory Visit: Payer: Self-pay | Admitting: Family Medicine

## 2015-02-24 NOTE — Progress Notes (Signed)
Chief Complaint  Patient presents with  . Follow-up    pt has no complaints    History of Present Illness: 70 yo WM with history of severe MR s/p mitral valve repair here today for f/u.  He was found to have severe MR April 2012. He underwent cardiac cath 05/23/10 which showed mild non-obstructive CAD and mild LV dysfunction with EF of 50%. TEE 05/13/10 with evidence of flail anterior mitral leaflet. He has undergone mitral valve repair per Dr. Roxy Manns May 2012. He had atrial fibrillation post-op and was started on amiodarone and coumadin but both were stopped in September 2012. He has been maintained on metoprolol and appears to have maintained sinus rhythm over the past five years. Echo September 2012 with normal LV systolic function, moderate LVH, appropriate appearance of repaired mitral valve.   He is here today for follow up. No chest pain, SOB, dizziness, near syncope or syncope. He has not been aware of any irregularity of his heart rhythm. No palpitations. He reports some erectile dysfunction and thinks it may be the beta blocker.   Primary Care Physician: Zigmund Gottron, MD   Past Medical History  Diagnosis Date  . Mitral regurgitation   . Myxomatous degeneration of mitral valve   . Atrial fibrillation (HCC)     Post-op. No recurrence.   . Tobacco abuse   . Hypercholesteremia 04/21/2011    Past Surgical History  Procedure Laterality Date  . Tonsillectomy    . Shoulder surgery    . Mitral valve repair  06/14/2010    right minithoracotomy for complex valvuloplasty with 39mm ring annuloplasty - Dr. Roxy Manns    Current Outpatient Prescriptions  Medication Sig Dispense Refill  . amoxicillin (AMOXIL) 500 MG capsule Take 4 capsules by mouth one hour prior to dental procedure. 4 capsule 0  . diazepam (VALIUM) 5 MG tablet Take 1 tablet (5 mg total) by mouth every 12 (twelve) hours as needed for anxiety. (Patient taking differently: Take 5 mg by mouth every 12 (twelve) hours as  needed for muscle spasms. ) 30 tablet 1  . diazepam (VALIUM) 5 MG tablet Take 5 mg by mouth every 6 (six) hours as needed for muscle spasms.    . Glucosamine-Chondroit-Vit C-Mn (GLUCOSAMINE 1500 COMPLEX PO) Take 1,500 mg by mouth daily.      . metoprolol tartrate (LOPRESSOR) 25 MG tablet TAKE 1 TABLET BY MOUTH TWICE A DAY 180 tablet 0  . Multiple Vitamin (MULTIVITAMIN) tablet Take 1 tablet by mouth daily.      . pravastatin (PRAVACHOL) 40 MG tablet TAKE 1 TABLET BY MOUTH EVERY DAY 90 tablet 3  . aspirin 81 MG tablet Take 81 mg by mouth daily.      . citalopram (CELEXA) 20 MG tablet TAKE 1 TABLET BY MOUTH EVERY DAY 90 tablet 3   No current facility-administered medications for this visit.    No Known Allergies  Social History   Social History  . Marital Status: Married    Spouse Name: N/A  . Number of Children: N/A  . Years of Education: N/A   Occupational History  . Not on file.   Social History Main Topics  . Smoking status: Former Smoker    Quit date: 03/14/1977  . Smokeless tobacco: Never Used  . Alcohol Use: 1.5 oz/week    3 drink(s) per week  . Drug Use: No  . Sexual Activity: Not on file   Other Topics Concern  . Not on file   Social History  Narrative    Family History  Problem Relation Age of Onset  . Heart failure Mother   . Heart disease Brother   . Heart disease Brother     Review of Systems:  As stated in the HPI and otherwise negative.   BP 110/78 mmHg  Pulse 70  Ht 6\' 1"  (1.854 m)  Wt 235 lb 6.4 oz (106.777 kg)  BMI 31.06 kg/m2  Physical Examination: General: Well developed, well nourished, NAD HEENT: OP clear, mucus membranes moist SKIN: warm, dry. No rashes. Neuro: No focal deficits Musculoskeletal: Muscle strength 5/5 all ext Psychiatric: Mood and affect normal Neck: No JVD, no carotid bruits, no thyromegaly, no lymphadenopathy. Lungs:Clear bilaterally, no wheezes, rhonci, crackles Cardiovascular: Regular rate and rhythm. No murmurs,  gallops or rubs. Abdomen:Soft. Bowel sounds present. Non-tender.  Extremities: No lower extremity edema. Pulses are 2 + in the bilateral DP/PT.  EKG:  EKG is ordered today. The ekg ordered today demonstrates NSR, rate 73 bpm. 1st degree AV block.   Recent Labs: 09/16/2014: ALT 18; BUN 11; Creat 0.76; Potassium 4.2; Sodium 138    Wt Readings from Last 3 Encounters:  02/25/15 235 lb 6.4 oz (106.777 kg)  09/16/14 235 lb 8 oz (106.822 kg)  12/25/13 231 lb 1.9 oz (104.835 kg)     Other studies Reviewed: Additional studies/ records that were reviewed today include: . Review of the above records demonstrates:    Assessment and Plan:   1. S/P mitral valve repair:  Stable post MV repair. No murmur on exam. Will repeat echo now. He will need to take antibiotics before dental procedures.    2. Paroxysmal atrial fibrillation: Occurred post-op in 2012 and no occurrence since. Sinus today. Continue ASA. Will hold beta blocker with erectile dysfunction.   Current medicines are reviewed at length with the patient today.  The patient does not have concerns regarding medicines.  The following changes have been made:  no change  Labs/ tests ordered today include:   Orders Placed This Encounter  Procedures  . EKG 12-Lead  . Echocardiogram    Disposition:   FU with me in 12  months  Signed, Lauree Chandler, MD 02/25/2015 12:16 PM    [address], [place], [place]  [address] Phone: [phone]; Fax: [phone]

## 2015-02-25 ENCOUNTER — Encounter: Payer: Self-pay | Admitting: Cardiovascular Disease

## 2015-02-25 ENCOUNTER — Ambulatory Visit (INDEPENDENT_AMBULATORY_CARE_PROVIDER_SITE_OTHER): Payer: PPO | Admitting: Cardiovascular Disease

## 2015-02-25 VITALS — BP 110/78 | HR 70 | Ht 73.0 in | Wt 235.4 lb

## 2015-02-25 DIAGNOSIS — I059 Rheumatic mitral valve disease, unspecified: Secondary | ICD-10-CM

## 2015-02-25 DIAGNOSIS — I480 Paroxysmal atrial fibrillation: Secondary | ICD-10-CM

## 2015-02-25 DIAGNOSIS — Z9889 Other specified postprocedural states: Secondary | ICD-10-CM

## 2015-02-25 NOTE — Patient Instructions (Signed)

## 2015-03-05 ENCOUNTER — Ambulatory Visit: Payer: PPO | Admitting: Cardiovascular Disease

## 2015-03-10 ENCOUNTER — Other Ambulatory Visit (HOSPITAL_COMMUNITY): Payer: PPO

## 2015-03-11 ENCOUNTER — Ambulatory Visit (HOSPITAL_COMMUNITY)
Admission: RE | Admit: 2015-03-11 | Discharge: 2015-03-11 | Disposition: A | Discharge status: Home / Self Care | Payer: PPO | Source: Ambulatory Visit | Attending: Cardiovascular Disease | Admitting: Cardiovascular Disease

## 2015-03-11 DIAGNOSIS — I059 Rheumatic mitral valve disease, unspecified: Secondary | ICD-10-CM | POA: Diagnosis not present

## 2015-03-11 DIAGNOSIS — E7800 Pure hypercholesterolemia, unspecified: Secondary | ICD-10-CM | POA: Insufficient documentation

## 2015-03-11 DIAGNOSIS — Z720 Tobacco use: Secondary | ICD-10-CM | POA: Insufficient documentation

## 2015-03-11 DIAGNOSIS — I119 Hypertensive heart disease without heart failure: Secondary | ICD-10-CM | POA: Diagnosis not present

## 2015-03-11 DIAGNOSIS — I480 Paroxysmal atrial fibrillation: Secondary | ICD-10-CM | POA: Diagnosis not present

## 2015-03-11 DIAGNOSIS — Z9889 Other specified postprocedural states: Secondary | ICD-10-CM | POA: Diagnosis not present

## 2015-03-21 ENCOUNTER — Other Ambulatory Visit: Payer: Self-pay | Admitting: Cardiovascular Disease

## 2015-04-08 ENCOUNTER — Telehealth: Payer: Self-pay | Admitting: Family Medicine

## 2015-04-08 NOTE — Telephone Encounter (Signed)
Contacted pt and scheduled this lab appointment for 04/12/15 in the AM due to the need to fast. Kenneth Mclaughlin, April D, CMA

## 2015-04-08 NOTE — Telephone Encounter (Signed)
The existing orders (triglycerides, direct LDL and TSH) are what he needs.  Please be sure he is fasting.

## 2015-04-08 NOTE — Telephone Encounter (Signed)
Pt is calling because he was suppose to make an appointment with the lab per Dr. Andria Frames. He had forgotten to make this appointment. I didn't see any recent orders so if the patient does need this can we put the orders in. He is scheduled for 04/13/15. Kenneth Mclaughlin

## 2015-04-08 NOTE — Telephone Encounter (Signed)
Forwarding to PCP, saw some future orders from last September, checking if these are still the labs he needs or if there are some others that need to be entered. Please advise. Katharina Caper, April D, [place]

## 2015-04-12 ENCOUNTER — Other Ambulatory Visit: Payer: PPO

## 2015-04-13 ENCOUNTER — Other Ambulatory Visit: Payer: PPO

## 2015-04-26 ENCOUNTER — Other Ambulatory Visit: Payer: PPO

## 2015-04-26 DIAGNOSIS — E7800 Pure hypercholesterolemia, unspecified: Secondary | ICD-10-CM | POA: Diagnosis not present

## 2015-04-26 DIAGNOSIS — E785 Hyperlipidemia, unspecified: Secondary | ICD-10-CM | POA: Diagnosis not present

## 2015-04-26 LAB — TSH: TSH: 3.19 m[IU]/L (ref 0.40–4.50)

## 2015-04-26 LAB — LDL CHOLESTEROL, DIRECT: LDL DIRECT: 117 mg/dL (ref <130)

## 2015-04-26 LAB — TRIGLYCERIDES: TRIGLYCERIDES: 126 mg/dL (ref <150)

## 2015-04-26 NOTE — Progress Notes (Signed)
Labs done today Kenneth Mclaughlin 

## 2015-05-04 DIAGNOSIS — M23304 Other meniscus derangements, unspecified medial meniscus, left knee: Secondary | ICD-10-CM | POA: Diagnosis not present

## 2015-05-04 DIAGNOSIS — M1712 Unilateral primary osteoarthritis, left knee: Secondary | ICD-10-CM | POA: Diagnosis not present

## 2015-05-07 DIAGNOSIS — M25762 Osteophyte, left knee: Secondary | ICD-10-CM | POA: Diagnosis not present

## 2015-05-07 DIAGNOSIS — M25562 Pain in left knee: Secondary | ICD-10-CM | POA: Diagnosis not present

## 2015-05-07 DIAGNOSIS — M25462 Effusion, left knee: Secondary | ICD-10-CM | POA: Diagnosis not present

## 2015-05-07 DIAGNOSIS — M23201 Derangement of unspecified lateral meniscus due to old tear or injury, left knee: Secondary | ICD-10-CM | POA: Diagnosis not present

## 2015-05-07 DIAGNOSIS — M1712 Unilateral primary osteoarthritis, left knee: Secondary | ICD-10-CM | POA: Diagnosis not present

## 2015-05-07 DIAGNOSIS — M23304 Other meniscus derangements, unspecified medial meniscus, left knee: Secondary | ICD-10-CM | POA: Diagnosis not present

## 2015-05-27 ENCOUNTER — Emergency Department (HOSPITAL_BASED_OUTPATIENT_CLINIC_OR_DEPARTMENT_OTHER)
Admission: EM | Admit: 2015-05-27 | Discharge: 2015-05-27 | Disposition: A | Discharge status: Home / Self Care | Payer: PPO | Attending: Emergency Medicine | Admitting: Emergency Medicine

## 2015-05-27 ENCOUNTER — Encounter (HOSPITAL_BASED_OUTPATIENT_CLINIC_OR_DEPARTMENT_OTHER): Payer: Self-pay

## 2015-05-27 ENCOUNTER — Emergency Department (HOSPITAL_BASED_OUTPATIENT_CLINIC_OR_DEPARTMENT_OTHER): Payer: PPO

## 2015-05-27 DIAGNOSIS — I4891 Unspecified atrial fibrillation: Secondary | ICD-10-CM | POA: Insufficient documentation

## 2015-05-27 DIAGNOSIS — R0982 Postnasal drip: Secondary | ICD-10-CM | POA: Diagnosis not present

## 2015-05-27 DIAGNOSIS — E7800 Pure hypercholesterolemia, unspecified: Secondary | ICD-10-CM | POA: Diagnosis not present

## 2015-05-27 DIAGNOSIS — J069 Acute upper respiratory infection, unspecified: Secondary | ICD-10-CM | POA: Diagnosis not present

## 2015-05-27 DIAGNOSIS — R05 Cough: Secondary | ICD-10-CM | POA: Diagnosis not present

## 2015-05-27 DIAGNOSIS — Z87891 Personal history of nicotine dependence: Secondary | ICD-10-CM | POA: Diagnosis not present

## 2015-05-27 MED ORDER — HYDROCODONE-ACETAMINOPHEN 5-325 MG PO TABS: ORAL_TABLET | ORAL | Status: DC

## 2015-05-27 MED ORDER — AZITHROMYCIN 250 MG PO TABS: ORAL_TABLET | ORAL | Status: DC

## 2015-05-27 MED FILL — HYDROCODON-APAP 5-325: 5-325 | 1 days supply | Qty: 7 | Fill #0

## 2015-05-27 MED FILL — AZITHROMYCIN 250 MG TABLET: 250 | 5 days supply | Qty: 6 | Fill #0

## 2015-05-27 NOTE — ED Notes (Signed)
C/o head/chest congestion, sore throat prod cough-s/s started 1.5 months ago-NAD-steady gait

## 2015-05-27 NOTE — Discharge Instructions (Signed)
You can try over-the-counter allergy medication such as Allegra/decongestant or Claritin/decongestant.  Use nasal saline (you can try [employer] Simply Saline) at least 4 times a day, use saline 5-10 minutes before using the fluticasone (flonase) nasal spray  Do not use Afrin (Oxymetazoline)  Rest, wash hands frequently  and drink plenty of water.  Push fluids to try to thin the mucus and get it to flow out the nose.   Take vicodin for breakthrough pain, do not drink alcohol, drive, care for children or do other critical tasks while taking vicodin.   Upper Respiratory Infection, Adult Most upper respiratory infections (URIs) are a viral infection of the air passages leading to the lungs. A URI affects the nose, throat, and upper air passages. The most common type of URI is nasopharyngitis and is typically referred to as "the common cold." URIs run their course and usually go away on their own. Most of the time, a URI does not require medical attention, but sometimes a bacterial infection in the upper airways can follow a viral infection. This is called a secondary infection. Sinus and middle ear infections are common types of secondary upper respiratory infections. Bacterial pneumonia can also complicate a URI. A URI can worsen asthma and chronic obstructive pulmonary disease (COPD). Sometimes, these complications can require emergency medical care and may be life threatening.  CAUSES Almost all URIs are caused by viruses. A virus is a type of germ and can spread from one person to another.  RISKS FACTORS You may be at risk for a URI if:   You smoke.   You have chronic heart or lung disease.  You have a weakened defense (immune) system.   You are very young or very old.   You have nasal allergies or asthma.  You work in crowded or poorly ventilated areas.  You work in health care facilities or schools. SIGNS AND SYMPTOMS  Symptoms typically develop 2-3 days after you come  in contact with a cold virus. Most viral URIs last 7-10 days. However, viral URIs from the influenza virus (flu virus) can last 14-18 days and are typically more severe. Symptoms may include:   Runny or stuffy (congested) nose.   Sneezing.   Cough.   Sore throat.   Headache.   Fatigue.   Fever.   Loss of appetite.   Pain in your forehead, behind your eyes, and over your cheekbones (sinus pain).  Muscle aches.  DIAGNOSIS  Your health care provider may diagnose a URI by:  Physical exam.  Tests to check that your symptoms are not due to another condition such as:  Strep throat.  Sinusitis.  Pneumonia.  Asthma. TREATMENT  A URI goes away on its own with time. It cannot be cured with medicines, but medicines may be prescribed or recommended to relieve symptoms. Medicines may help:  Reduce your fever.  Reduce your cough.  Relieve nasal congestion. HOME CARE INSTRUCTIONS   Take medicines only as directed by your health care provider.   Gargle warm saltwater or take cough drops to comfort your throat as directed by your health care provider.  Use a warm mist humidifier or inhale steam from a shower to increase air moisture. This may make it easier to breathe.  Drink enough fluid to keep your urine clear or pale yellow.   Eat soups and other clear broths and maintain good nutrition.   Rest as needed.   Return to work when your temperature has returned to normal  or as your health care provider advises. You may need to stay home longer to avoid infecting others. You can also use a face mask and careful hand washing to prevent spread of the virus.  Increase the usage of your inhaler if you have asthma.   Do not use any tobacco products, including cigarettes, chewing tobacco, or electronic cigarettes. If you need help quitting, ask your health care provider. PREVENTION  The best way to protect yourself from getting a cold is to practice good hygiene.     Avoid oral or hand contact with people with cold symptoms.   Wash your hands often if contact occurs.  There is no clear evidence that vitamin C, vitamin E, echinacea, or exercise reduces the chance of developing a cold. However, it is always recommended to get plenty of rest, exercise, and practice good nutrition.  SEEK MEDICAL CARE IF:   You are getting worse rather than better.   Your symptoms are not controlled by medicine.   You have chills.  You have worsening shortness of breath.  You have brown or red mucus.  You have yellow or brown nasal discharge.  You have pain in your face, especially when you bend forward.  You have a fever.  You have swollen neck glands.  You have pain while swallowing.  You have white areas in the back of your throat. SEEK IMMEDIATE MEDICAL CARE IF:   You have severe or persistent:  Headache.  Ear pain.  Sinus pain.  Chest pain.  You have chronic lung disease and any of the following:  Wheezing.  Prolonged cough.  Coughing up blood.  A change in your usual mucus.  You have a stiff neck.  You have changes in your:  Vision.  Hearing.  Thinking.  Mood. MAKE SURE YOU:   Understand these instructions.  Will watch your condition.  Will get help right away if you are not doing well or get worse.   This information is not intended to replace advice given to you by your health care provider. Make sure you discuss any questions you have with your health care provider.   Document Released: 06/28/2000 Document Revised: 05/19/2014 Document Reviewed: 04/09/2013 Elsevier Interactive Patient Education [employer].

## 2015-05-27 NOTE — ED Provider Notes (Signed)
CSN: [id number]     Arrival date & time 05/27/15  1609 History   First MD Initiated Contact with Patient 05/27/15 1624     Chief Complaint  Patient presents with  . Cough     (Consider location/radiation/quality/duration/timing/severity/associated sxs/prior Treatment) HPI   Blood pressure 129/88, pulse 90, temperature 98.6 F (37 C), temperature source Oral, resp. rate 20, height 6' (1.829 m), weight 102.513 kg, SpO2 96 %.  Kenneth Mclaughlin is a 70 y.o. male complaining of 6 weeks of progressive rhinorrhea, sinus pressure, sore throat, productive cough which is keeping him from sleep at night. Patient endorses tactile fever denies chest pain, shortness of breath, nausea, vomiting, abdominal pain, change in bowel or bladder habits, otalgia. No history of seasonal allergies, is not taking any over-the-counter medications. He did try taking amoxicillin which she had for pretreatment for dental work for his nonnative heart valve. This had 3 tabs in the last 24 hours with little relief. Quit smoking in the 80s, no history of COPD.  Past Medical History  Diagnosis Date  . Mitral regurgitation   . Myxomatous degeneration of mitral valve   . Atrial fibrillation (HCC)     Post-op. No recurrence.   . Tobacco abuse   . Hypercholesteremia 04/21/2011   Past Surgical History  Procedure Laterality Date  . Tonsillectomy    . Shoulder surgery    . Mitral valve repair  06/14/2010    right minithoracotomy for complex valvuloplasty with 16mm ring annuloplasty - Dr. Roxy Manns   Family History  Problem Relation Age of Onset  . Heart failure Mother   . Heart disease Brother   . Heart disease Brother    Social History  Substance Use Topics  . Smoking status: Former Smoker    Quit date: 03/14/1977  . Smokeless tobacco: Never Used  . Alcohol Use: Yes     Comment: weekly    Review of Systems  10 systems reviewed and found to be negative, except as noted in the HPI.   Allergies  Review of patient's  allergies indicates no known allergies.  Home Medications   Prior to Admission medications   Medication Sig Start Date End Date Taking? Authorizing Provider  aspirin 81 MG tablet Take 81 mg by mouth daily.      Historical Provider, MD  citalopram (CELEXA) 20 MG tablet TAKE 1 TABLET BY MOUTH EVERY DAY 01/19/15   Zenia Resides, MD  diazepam (VALIUM) 5 MG tablet Take 1 tablet (5 mg total) by mouth every 12 (twelve) hours as needed for anxiety. Patient taking differently: Take 5 mg by mouth every 12 (twelve) hours as needed for muscle spasms.  09/16/14   Zenia Resides, MD  diazepam (VALIUM) 5 MG tablet Take 5 mg by mouth every 6 (six) hours as needed for muscle spasms.    Historical Provider, MD  Glucosamine-Chondroit-Vit C-Mn (GLUCOSAMINE 1500 COMPLEX PO) Take 1,500 mg by mouth daily.      Historical Provider, MD  metoprolol tartrate (LOPRESSOR) 25 MG tablet TAKE 1 TABLET BY MOUTH TWICE A DAY 03/22/15   Burnell Blanks, MD  Multiple Vitamin (MULTIVITAMIN) tablet Take 1 tablet by mouth daily.      Historical Provider, MD  pravastatin (PRAVACHOL) 40 MG tablet TAKE 1 TABLET BY MOUTH EVERY DAY 02/09/15   Zenia Resides, MD   BP 129/88 mmHg  Pulse 90  Temp(Src) 98.6 F (37 C) (Oral)  Resp 20  Ht 6' (1.829 m)  Wt 102.513 kg  BMI 30.64 kg/m2  SpO2 96% Physical Exam  Constitutional: He appears well-developed and well-nourished.  HENT:  Head: Normocephalic.  Right Ear: External ear normal.  Left Ear: External ear normal.  Mouth/Throat: Oropharynx is clear and moist. No oropharyngeal exudate.  No drooling or stridor. Posterior pharynx mildly erythematous no significant tonsillar hypertrophy. No exudate. Soft palate rises symmetrically. No TTP or induration under tongue.   No tenderness to palpation of frontal or bilateral maxillary sinuses.  Mild mucosal edema in the nares with scant rhinorrhea.  Bilateral tympanic membranes with normal architecture and good light reflex.     Eyes: Conjunctivae and EOM are normal. Pupils are equal, round, and reactive to light.  Neck: Normal range of motion. Neck supple.  Cardiovascular: Normal rate and regular rhythm.   Pulmonary/Chest: Effort normal and breath sounds normal. No stridor. No respiratory distress. He has no wheezes. He has no rales. He exhibits no tenderness.  Abdominal: Soft. There is no tenderness. There is no rebound and no guarding.  Nursing note and vitals reviewed.   ED Course  Procedures (including critical care time) Labs Review Labs Reviewed - No data to display  Imaging Review Dg Chest 2 View  05/27/2015  CLINICAL DATA:  Cough congestion for 1-1/2 months EXAM: CHEST  2 VIEW COMPARISON:  07/04/2010 FINDINGS: Subtle bilateral nipple shadows. The heart size and vascular pattern are normal. Mild ectasia of the aorta. Lungs are clear. No pleural effusions. Mitral valve repair noted. IMPRESSION: No acute finding Electronically Signed   By: Skipper Cliche M.D.   On: 05/27/2015 16:45   I have personally reviewed and evaluated these images and lab results as part of my medical decision-making.   EKG Interpretation None      MDM   Final diagnoses:  URI (upper respiratory infection)  Post-nasal drip    Filed Vitals:   05/27/15 1620  BP: 129/88  Pulse: 90  Temp: 98.6 F (37 C)  TempSrc: Oral  Resp: 20  Height: 6' (1.829 m)  Weight: 102.513 kg  SpO2: 96%     Kenneth Mclaughlin is 70 y.o. male presenting with Persistent rhinorrhea, productive cough sore throat onset 6 weeks ago, lung sounds clear to auscultation, patient is afebrile and overall very well appearing. Chest x-rays without infiltrate. No signs of a bacterial sinusitis. Given the length of his symptoms will start him on a Z-Pak, I think it may be related to seasonal allergies, patient is advised to obtain over-the-counter medications like Claritin decongestant. Advised to follow with primary care if symptoms do not resolve. Patient  verbalizes understanding.  Evaluation does not show pathology that would require ongoing emergent intervention or inpatient treatment. Pt is hemodynamically stable and mentating appropriately. Discussed findings and plan with patient/guardian, who agrees with care plan. All questions answered. Return precautions discussed and outpatient follow up given.   New Prescriptions   AZITHROMYCIN (ZITHROMAX Z-PAK) 250 MG TABLET    2 po day one, then 1 daily x 4 days   HYDROCODONE-ACETAMINOPHEN (NORCO/VICODIN) 5-325 MG TABLET    Take 1-2 tablets by mouth every 6 hours as needed for pain and/or cough.         Monico Blitz, PA-C 05/27/15 1656  Quintella Reichert, MD 05/27/15 [phone]

## 2015-06-11 DIAGNOSIS — S83232A Complex tear of medial meniscus, current injury, left knee, initial encounter: Secondary | ICD-10-CM | POA: Diagnosis not present

## 2015-06-11 DIAGNOSIS — M23301 Other meniscus derangements, unspecified lateral meniscus, left knee: Secondary | ICD-10-CM | POA: Diagnosis not present

## 2015-06-11 DIAGNOSIS — M23201 Derangement of unspecified lateral meniscus due to old tear or injury, left knee: Secondary | ICD-10-CM | POA: Diagnosis not present

## 2015-06-11 DIAGNOSIS — M23204 Derangement of unspecified medial meniscus due to old tear or injury, left knee: Secondary | ICD-10-CM | POA: Diagnosis not present

## 2015-06-11 DIAGNOSIS — M23304 Other meniscus derangements, unspecified medial meniscus, left knee: Secondary | ICD-10-CM | POA: Diagnosis not present

## 2015-06-11 DIAGNOSIS — M1712 Unilateral primary osteoarthritis, left knee: Secondary | ICD-10-CM | POA: Diagnosis not present

## 2015-06-17 HISTORY — PX: KNEE ARTHROSCOPY: SUR90

## 2015-08-24 ENCOUNTER — Encounter: Payer: Self-pay | Admitting: Family Medicine

## 2015-08-24 ENCOUNTER — Ambulatory Visit (INDEPENDENT_AMBULATORY_CARE_PROVIDER_SITE_OTHER): Payer: PPO | Admitting: Family Medicine

## 2015-08-24 VITALS — BP 123/80 | HR 89 | Temp 98.2°F | Wt 220.0 lb

## 2015-08-24 DIAGNOSIS — J069 Acute upper respiratory infection, unspecified: Secondary | ICD-10-CM | POA: Diagnosis not present

## 2015-08-24 MED ORDER — GUAIFENESIN-CODEINE 100-10 MG/5ML PO SYRP: 5.0000 mL | ORAL_SOLUTION | Freq: Three times a day (TID) | ORAL | 0 refills | Status: DC | PRN

## 2015-08-24 NOTE — Patient Instructions (Addendum)
Thank you for coming in today, it was so nice to see you. Today we talked about:   Chest congestion: This is likely a viral upper respiratory infection which will just have to take some time to resolve. I will give you a cough suppressant as well.    Gargle warm saltwater or take cough drops to comfort your throat as directed by your health care provider.  Try a teaspoon of honey for cough and add it to warm tea.   Use a warm mist humidifier or inhale steam from a shower to increase air moisture. This may make it easier to breathe.  Drink enough fluid to keep your urine clear or pale yellow.   Eat soups and other clear broths and maintain good nutrition.   Rest as needed.    Please follow up if you develop a fever and your symptoms don't get better in 1 month.   Reasons to go to the hospital: shortness of breath, chest pain, or coughing up blood   If you have any questions or concerns, please do not hesitate to call the office at [phone]. You can also message me directly via MyChart.   Sincerely,  Smitty Cords, MD

## 2015-08-24 NOTE — Progress Notes (Signed)
   Subjective:    Patient ID: Kenneth Mclaughlin , male   DOB: 12/09/1945 , 70 y.o..   MRN: [id number]  HPI  [name] is here for chest congestion and cough.  Started feeling congested about 1 week ago and then developed cough. Also had a URI 4 months ago that has resolved. He has noticed that his symptoms appear to be similar to how they were 4 months ago. He states he feels "physically fine" but is worried this is going to get worse like his last URI 4 months ago. He is going to his 50th high school reunion this weekend and wants to make sure he is healthy enough to attend.  Course The cough has been getting progressively worse over the last week Severity: Mild Worse with: Laughing  Better with: Nothing identified that makes the cough better Sick contacts: None  Symptoms Sputum:yes- yellow/green Fever: no  Shortness of breath:no  Leg Swelling:no  Heart Burn or Reflux:no  Wheezing:no  Post Nasal Drip: unsure   Red Flags Weight Loss:  no Immunocompromised:  no  PMH Asthma or COPD: no  PMH of Smoking: Has not smoked in decades  Using ACEIs: no   Review of Systems: Per HPI. All other systems reviewed and are negative.  Social Hx:  reports that he quit smoking about 38 years ago. He has never used smokeless tobacco.    Objective:   BP 123/80   Pulse 89   Temp 98.2 F (36.8 C) (Oral)   Wt 220 lb (99.8 kg)   SpO2 96%   BMI 29.84 kg/m  Physical Exam  Constitutional: He appears well-developed and well-nourished. No distress.  pleasant  HENT:  Head: Normocephalic and atraumatic.  Right Ear: Tympanic membrane, external ear and ear canal normal. No drainage.  Left Ear: Tympanic membrane, external ear and ear canal normal. No drainage.  Nose: Mucosal edema and rhinorrhea present. No epistaxis. Right sinus exhibits no maxillary sinus tenderness and no frontal sinus tenderness. Left sinus exhibits no maxillary sinus tenderness and no frontal sinus tenderness.  Mouth/Throat:  Mucous membranes are normal. Posterior oropharyngeal erythema present. No oropharyngeal exudate.  Eyes: Conjunctivae are normal. Pupils are equal, round, and reactive to light. Right eye exhibits no discharge. Left eye exhibits no discharge. No scleral icterus.  Neck: Normal range of motion. Neck supple.  Cardiovascular: Normal rate, regular rhythm and normal heart sounds.   Pulmonary/Chest: Effort normal and breath sounds normal. No respiratory distress. He has no wheezes.  Lymphadenopathy:    He has no cervical adenopathy.  Skin: Skin is warm and dry. Capillary refill takes less than 2 seconds.  Psychiatric: He has a normal mood and affect. Judgment and thought content normal.    Assessment & Plan:  ASSESSMENT:  viral upper respiratory illness. Cough likely from sore throat with post nasal drip  PLAN: Symptomatic therapy suggested: push fluids, rest, gargle warm salt water, use vaporizer or mist prn, use acetaminophen, Robitussin CF prn and return office visit prn if symptoms persist or worsen. Lack of antibiotic effectiveness discussed with him. Call or return to clinic in 1 week if these symptoms worsen or fail to improve as anticipated.

## 2016-02-29 ENCOUNTER — Other Ambulatory Visit: Payer: Self-pay | Admitting: Family Medicine

## 2016-04-26 ENCOUNTER — Other Ambulatory Visit: Payer: Self-pay | Admitting: Family Medicine

## 2016-08-01 ENCOUNTER — Other Ambulatory Visit: Payer: Self-pay | Admitting: Family Medicine

## 2016-08-02 IMAGING — DX DG CHEST 2V
2 series · 2 of 2 positions shown · non-contrast
Comparison: 07/04/2010

CLINICAL DATA: Cough congestion for 1-1/2 months

EXAM:
CHEST  2 VIEW

[chest pa]
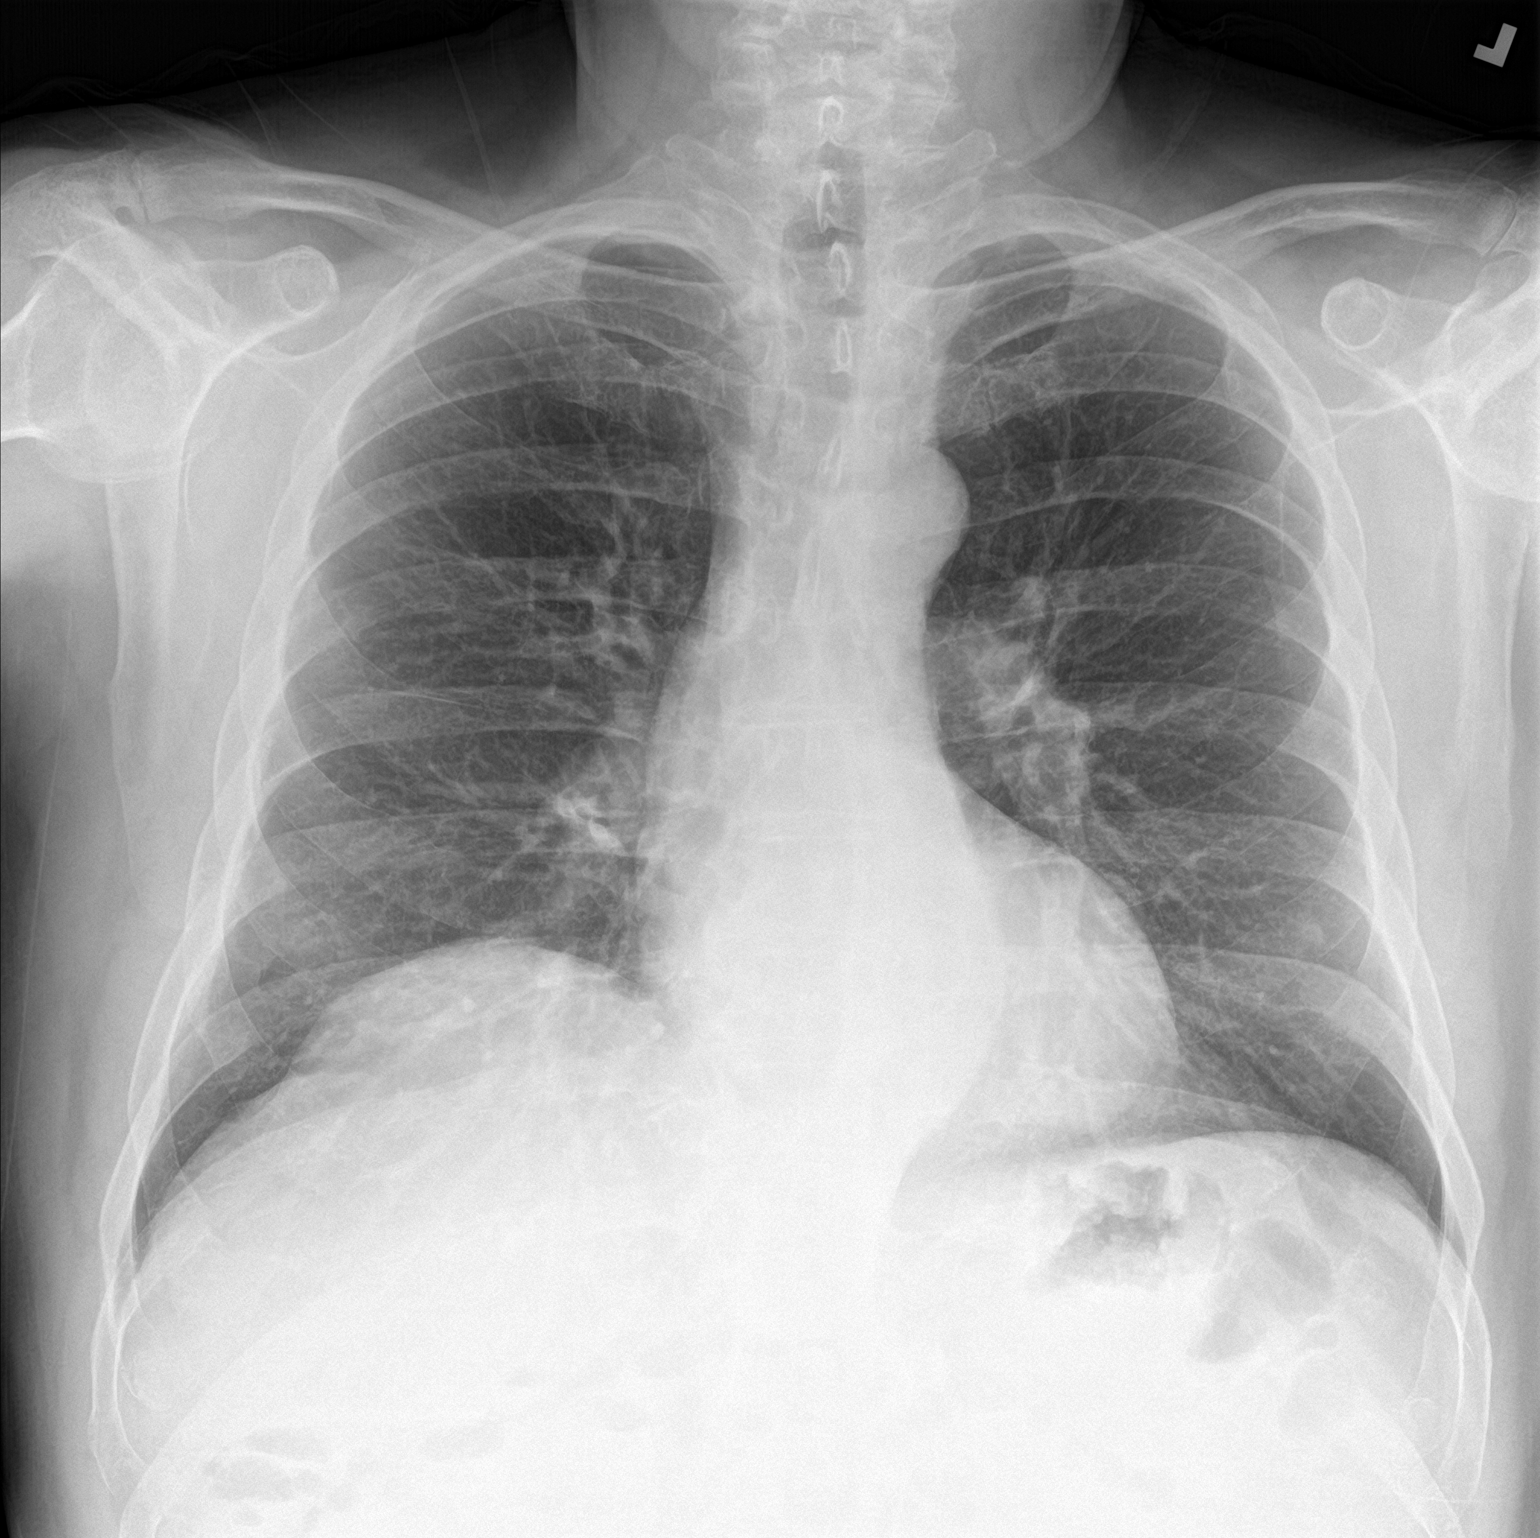

[chest lat]
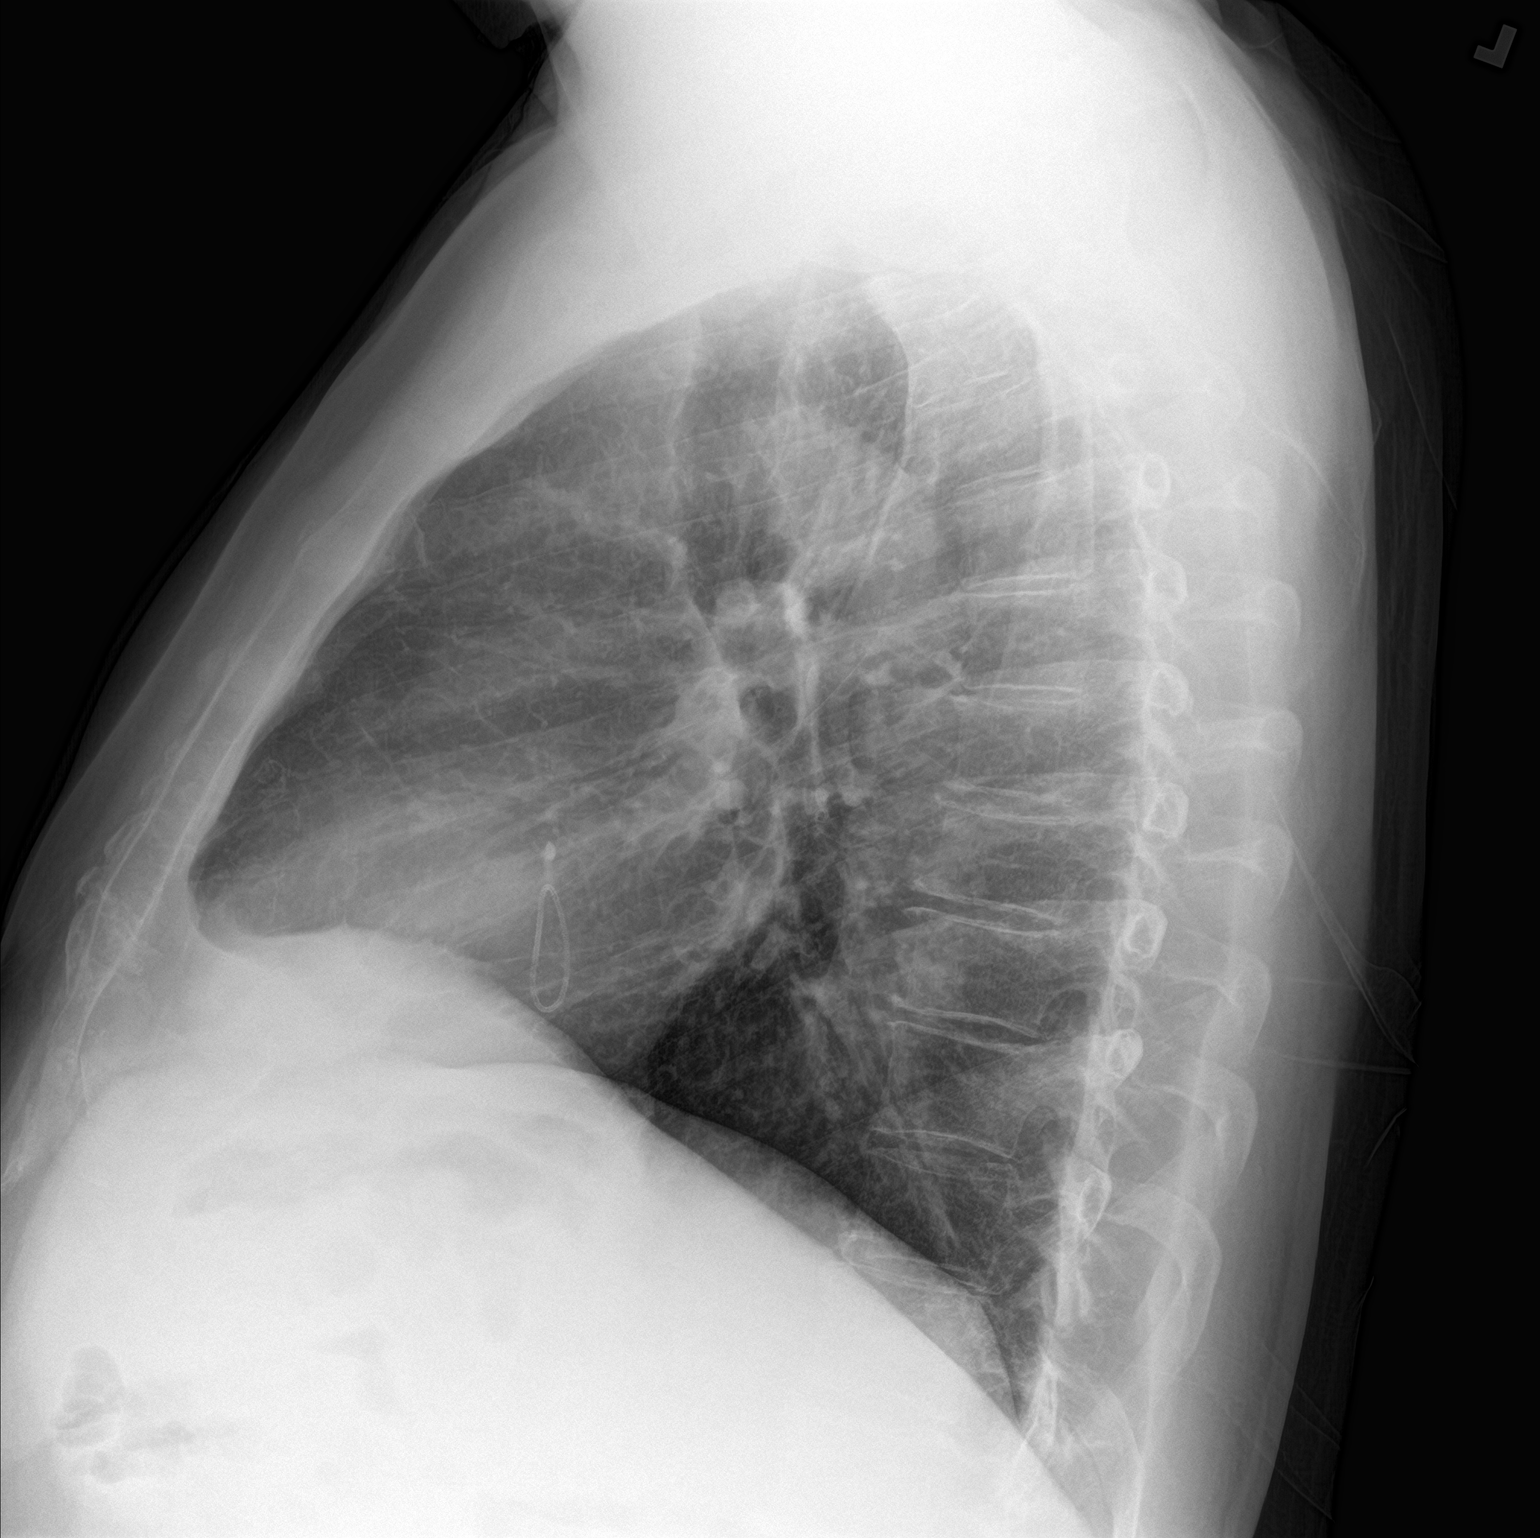

[2 of 2 positions shown; findings below may reference images not displayed]

FINDINGS: Subtle bilateral nipple shadows. The heart size and vascular pattern
are normal. Mild ectasia of the aorta. Lungs are clear. No pleural
effusions. Mitral valve repair noted.
IMPRESSION: No acute finding

## 2016-12-14 ENCOUNTER — Ambulatory Visit (INDEPENDENT_AMBULATORY_CARE_PROVIDER_SITE_OTHER): Payer: PPO | Admitting: Family Medicine

## 2016-12-14 ENCOUNTER — Other Ambulatory Visit: Payer: Self-pay

## 2016-12-14 ENCOUNTER — Encounter: Payer: Self-pay | Admitting: Family Medicine

## 2016-12-14 DIAGNOSIS — N529 Male erectile dysfunction, unspecified: Secondary | ICD-10-CM | POA: Insufficient documentation

## 2016-12-14 DIAGNOSIS — E785 Hyperlipidemia, unspecified: Secondary | ICD-10-CM | POA: Diagnosis not present

## 2016-12-14 DIAGNOSIS — Z9889 Other specified postprocedural states: Secondary | ICD-10-CM | POA: Diagnosis not present

## 2016-12-14 DIAGNOSIS — Z1211 Encounter for screening for malignant neoplasm of colon: Secondary | ICD-10-CM | POA: Diagnosis not present

## 2016-12-14 MED ORDER — VARDENAFIL HCL 10 MG PO TABS: 10.0000 mg | ORAL_TABLET | Freq: Every day | ORAL | 0 refills | Status: DC | PRN

## 2016-12-14 MED ORDER — PRAVASTATIN SODIUM 40 MG PO TABS: 40.0000 mg | ORAL_TABLET | ORAL | 3 refills | Status: DC

## 2016-12-14 NOTE — Assessment & Plan Note (Signed)
Trial of levitra  

## 2016-12-14 NOTE — Assessment & Plan Note (Signed)
Willing to screen

## 2016-12-14 NOTE — Patient Instructions (Addendum)
See your podiatrist to discuss surgical options for your bunion I put in a referral for a screening colonoscopy. Someone should call I sent in a small prescription for a trial.  Call me to let me know how things worked. I will call with blood test results.  Make sure you are fasting when you come in.   Please make an appointment with Lauren for your annual medicare wellness exam.   I probably can continue to see you once a year.

## 2016-12-14 NOTE — Assessment & Plan Note (Signed)
Not fasting today.  Return for fasting lipids plus other labs.

## 2016-12-14 NOTE — Progress Notes (Signed)
   Subjective:    Patient ID: Kenneth Mclaughlin, male    DOB: [birth year], 71 y.o.   MRN: [id number]  HPI  Once a year check.  I intend for him to return for annual medicare wellness exam.  Issues: 1. Doing very well S/P MVR.  Denies CP or DOE or leg swelling. 2. Bunion left foot especially.  Affecting gait and posture giving end of day back hip and knee pain.  Has a podiatrist.   3. Erectile dysfunction.  Would like to try meds. 4. Due for colonoscopy.  Now agrees to have.       Review of Systems Denies HA, bleeding or worrisome skin lesions.  No change in appetite, weight, bowel or bladder.     Objective:   Physical ExamLungs clear  Cardiac RRR with 1/6 SEM Abd benign Ext no edema.  Left foot. Sig bunion left 1st MTP joint.  Overlaps with hammar toe 2nd digit.         Assessment & Plan:

## 2016-12-14 NOTE — Assessment & Plan Note (Signed)
Doing well.  Will need SBE prophylaxis for colonoscopy.

## 2016-12-19 ENCOUNTER — Other Ambulatory Visit: Payer: PPO

## 2016-12-19 ENCOUNTER — Ambulatory Visit: Payer: PPO | Admitting: *Deleted

## 2016-12-20 ENCOUNTER — Other Ambulatory Visit: Payer: Self-pay

## 2016-12-20 ENCOUNTER — Other Ambulatory Visit: Payer: PPO

## 2016-12-20 ENCOUNTER — Ambulatory Visit (INDEPENDENT_AMBULATORY_CARE_PROVIDER_SITE_OTHER): Payer: PPO | Admitting: *Deleted

## 2016-12-20 ENCOUNTER — Encounter: Payer: Self-pay | Admitting: *Deleted

## 2016-12-20 VITALS — BP 130/66 | HR 40 | Temp 97.9°F | Ht 73.0 in | Wt 224.4 lb

## 2016-12-20 DIAGNOSIS — E785 Hyperlipidemia, unspecified: Secondary | ICD-10-CM | POA: Diagnosis not present

## 2016-12-20 DIAGNOSIS — Z0000 Encounter for general adult medical examination without abnormal findings: Secondary | ICD-10-CM

## 2016-12-20 NOTE — Patient Instructions (Addendum)
Kenneth Mclaughlin,  Thank you for taking time to come for yourMedicare Wellness Visit. I appreciate your ongoing commitment to your health goals. Please review the following plan we discussed and let me know if I can assist you in the future.   These are the goals we discussed:  Goals    . Contact podiatrist to take care of bunion    . Getting back to hiking and toning (pt-stated)    . Have colonoscopy (pt-stated)        Fall Prevention in the Home Falls can cause injuries. They can happen to people of all ages. There are many things you can do to make your home safe and to help prevent falls. What can I do on the outside of my home?  Regularly fix the edges of walkways and driveways and fix any cracks.  Remove anything that might make you trip as you walk through a door, such as a raised step or threshold.  Trim any bushes or trees on the path to your home.  Use bright outdoor lighting.  Clear any walking paths of anything that might make someone trip, such as rocks or tools.  Regularly check to see if handrails are loose or broken. Make sure that both sides of any steps have handrails.  Any raised decks and porches should have guardrails on the edges.  Have any leaves, snow, or ice cleared regularly.  Use sand or salt on walking paths during winter.  Clean up any spills in your garage right away. This includes oil or grease spills. What can I do in the bathroom?  Use night lights.  Install grab bars by the toilet and in the tub and shower. Do not use towel bars as grab bars.  Use non-skid mats or decals in the tub or shower.  If you need to sit down in the shower, use a plastic, non-slip stool.  Keep the floor dry. Clean up any water that spills on the floor as soon as it happens.  Remove soap buildup in the tub or shower regularly.  Attach bath mats securely with double-sided non-slip rug tape.  Do not have throw rugs and other things on the floor that can make you  trip. What can I do in the bedroom?  Use night lights.  Make sure that you have a light by your bed that is easy to reach.  Do not use any sheets or blankets that are too big for your bed. They should not hang down onto the floor.  Have a firm chair that has side arms. You can use this for support while you get dressed.  Do not have throw rugs and other things on the floor that can make you trip. What can I do in the kitchen?  Clean up any spills right away.  Avoid walking on wet floors.  Keep items that you use a lot in easy-to-reach places.  If you need to reach something above you, use a strong step stool that has a grab bar.  Keep electrical cords out of the way.  Do not use floor polish or wax that makes floors slippery. If you must use wax, use non-skid floor wax.  Do not have throw rugs and other things on the floor that can make you trip. What can I do with my stairs?  Do not leave any items on the stairs.  Make sure that there are handrails on both sides of the stairs and use them. Fix handrails that  are broken or loose. Make sure that handrails are as long as the stairways.  Check any carpeting to make sure that it is firmly attached to the stairs. Fix any carpet that is loose or worn.  Avoid having throw rugs at the top or bottom of the stairs. If you do have throw rugs, attach them to the floor with carpet tape.  Make sure that you have a light switch at the top of the stairs and the bottom of the stairs. If you do not have them, ask someone to add them for you. What else can I do to help prevent falls?  Wear shoes that: ? Do not have high heels. ? Have rubber bottoms. ? Are comfortable and fit you well. ? Are closed at the toe. Do not wear sandals.  If you use a stepladder: ? Make sure that it is fully opened. Do not climb a closed stepladder. ? Make sure that both sides of the stepladder are locked into place. ? Ask someone to hold it for you, if  possible.  Clearly mark and make sure that you can see: ? Any grab bars or handrails. ? First and last steps. ? Where the edge of each step is.  Use tools that help you move around (mobility aids) if they are needed. These include: ? Canes. ? Walkers. ? Scooters. ? Crutches.  Turn on the lights when you go into a dark area. Replace any light bulbs as soon as they burn out.  Set up your furniture so you have a clear path. Avoid moving your furniture around.  If any of your floors are uneven, fix them.  If there are any pets around you, be aware of where they are.  Review your medicines with your doctor. Some medicines can make you feel dizzy. This can increase your chance of falling. Ask your doctor what other things that you can do to help prevent falls. This information is not intended to replace advice given to you by your health care provider. Make sure you discuss any questions you have with your health care provider. Document Released: 10/29/2008 Document Revised: 06/10/2015 Document Reviewed: 02/06/2014 Elsevier Interactive Patient Education  2018 [employer] Maintenance, Male A healthy lifestyle and preventive care is important for your health and wellness. Ask your health care provider about what schedule of regular examinations is right for you. What should I know about weight and diet? Eat a Healthy Diet  Eat plenty of vegetables, fruits, whole grains, low-fat dairy products, and lean protein.  Do not eat a lot of foods high in solid fats, added sugars, or salt.  Maintain a Healthy Weight Regular exercise can help you achieve or maintain a healthy weight. You should:  Do at least 150 minutes of exercise each week. The exercise should increase your heart rate and make you sweat (moderate-intensity exercise).  Do strength-training exercises at least twice a week.  Watch Your Levels of Cholesterol and Blood Lipids  Have your blood tested for lipids and  cholesterol every 5 years starting at 69 years of age. If you are at high risk for heart disease, you should start having your blood tested when you are 69 years old. You may need to have your cholesterol levels checked more often if: ? Your lipid or cholesterol levels are high. ? You are older than 69 years of age. ? You are at high risk for heart disease.  What should I know about cancer screening? Many  types of cancers can be detected early and may often be prevented. Lung Cancer  You should be screened every year for lung cancer if: ? You are a current smoker who has smoked for at least 30 years. ? You are a former smoker who has quit within the past 15 years.  Talk to your health care provider about your screening options, when you should start screening, and how often you should be screened.  Colorectal Cancer  Routine colorectal cancer screening usually begins at 69 years of age and should be repeated every 5-10 years until you are 69 years old. You may need to be screened more often if early forms of precancerous polyps or small growths are found. Your health care provider may recommend screening at an earlier age if you have risk factors for colon cancer.  Your health care provider may recommend using home test kits to check for hidden blood in the stool.  A small camera at the end of a tube can be used to examine your colon (sigmoidoscopy or colonoscopy). This checks for the earliest forms of colorectal cancer.  Prostate and Testicular Cancer  Depending on your age and overall health, your health care provider may do certain tests to screen for prostate and testicular cancer.  Talk to your health care provider about any symptoms or concerns you have about testicular or prostate cancer.  Skin Cancer  Check your skin from head to toe regularly.  Tell your health care provider about any new moles or changes in moles, especially if: ? There is a change in a mole's size, shape,  or color. ? You have a mole that is larger than a pencil eraser.  Always use sunscreen. Apply sunscreen liberally and repeat throughout the day.  Protect yourself by wearing long sleeves, pants, a wide-brimmed hat, and sunglasses when outside.  What should I know about heart disease, diabetes, and high blood pressure?  If you are 41-76 years of age, have your blood pressure checked every 3-5 years. If you are 43 years of age or older, have your blood pressure checked every year. You should have your blood pressure measured twice-once when you are at a hospital or clinic, and once when you are not at a hospital or clinic. Record the average of the two measurements. To check your blood pressure when you are not at a hospital or clinic, you can use: ? An automated blood pressure machine at a pharmacy. ? A home blood pressure monitor.  Talk to your health care provider about your target blood pressure.  If you are between 56-71 years old, ask your health care provider if you should take aspirin to prevent heart disease.  Have regular diabetes screenings by checking your fasting blood sugar level. ? If you are at a normal weight and have a low risk for diabetes, have this test once every three years after the age of 16. ? If you are overweight and have a high risk for diabetes, consider being tested at a younger age or more often.  A one-time screening for abdominal aortic aneurysm (AAA) by ultrasound is recommended for men aged 69-75 years who are current or former smokers. What should I know about preventing infection? Hepatitis B If you have a higher risk for hepatitis B, you should be screened for this virus. Talk with your health care provider to find out if you are at risk for hepatitis B infection. Hepatitis C Blood testing is recommended for:  Everyone born  from [place] through 1963.  Anyone with known risk factors for hepatitis C.  Sexually Transmitted Diseases (STDs)  You should  be screened each year for STDs including gonorrhea and chlamydia if: ? You are sexually active and are younger than 69 years of age. ? You are older than 69 years of age and your health care provider tells you that you are at risk for this type of infection. ? Your sexual activity has changed since you were last screened and you are at an increased risk for chlamydia or gonorrhea. Ask your health care provider if you are at risk.  Talk with your health care provider about whether you are at high risk of being infected with HIV. Your health care provider may recommend a prescription medicine to help prevent HIV infection.  What else can I do?  Schedule regular health, dental, and eye exams.  Stay current with your vaccines (immunizations).  Do not use any tobacco products, such as cigarettes, chewing tobacco, and e-cigarettes. If you need help quitting, ask your health care provider.  Limit alcohol intake to no more than 2 drinks per day. One drink equals 12 ounces of beer, 5 ounces of wine, or 1 ounces of hard liquor.  Do not use street drugs.  Do not share needles.  Ask your health care provider for help if you need support or information about quitting drugs.  Tell your health care provider if you often feel depressed.  Tell your health care provider if you have ever been abused or do not feel safe at home. This information is not intended to replace advice given to you by your health care provider. Make sure you discuss any questions you have with your health care provider. Document Released: 07/01/2007 Document Revised: 09/01/2015 Document Reviewed: 10/06/2014 Elsevier Interactive Patient Education  2018 [employer].   Hearing Loss Hearing loss is a partial or total loss of the ability to hear. This can be temporary or permanent, and it can happen in one or both ears. Hearing loss may be referred to as deafness. Medical care is necessary to treat hearing loss properly and to  prevent the condition from getting worse. Your hearing may partially or completely come back, depending on what caused your hearing loss and how severe it is. In some cases, hearing loss is permanent. What are the causes? Common causes of hearing loss include:  Too much wax in the ear canal.  Infection of the ear canal or middle ear.  Fluid in the middle ear.  Injury to the ear or surrounding area.  An object stuck in the ear.  Prolonged exposure to loud sounds, such as music.  Less common causes of hearing loss include:  Tumors in the ear.  Viral or bacterial infections, such as meningitis.  A hole in the eardrum (perforated eardrum).  Problems with the hearing nerve that sends signals between the brain and the ear.  Certain medicines.  What are the signs or symptoms? Symptoms of this condition may include:  Difficulty telling the difference between sounds.  Difficulty following a conversation when there is background noise.  Lack of response to sounds in your environment. This may be most noticeable when you do not respond to startling sounds.  Needing to turn up the volume on the television, radio, etc.  Ringing in the ears.  Dizziness.  Pain in the ears.  How is this diagnosed? This condition is diagnosed based on a physical exam and a hearing test (audiometry). The  audiometry test will be performed by a hearing specialist (audiologist). You may also be referred to an ear, nose, and throat (ENT) specialist (otolaryngologist). How is this treated? Treatment for recent onset of hearing loss may include:  Ear wax removal.  Being prescribed medicines to prevent infection (antibiotics).  Being prescribed medicines to reduce inflammation (corticosteroids).  Follow these instructions at home:  If you were prescribed an antibiotic medicine, take it as told by your health care provider. Do not stop taking the antibiotic even if you start to feel better.  Take  over-the-counter and prescription medicines only as told by your health care provider.  Avoid loud noises.  Return to your normal activities as told by your health care provider. Ask your health care provider what activities are safe for you.  Keep all follow-up visits as told by your health care provider. This is important. Contact a health care provider if:  You feel dizzy.  You develop new symptoms.  You vomit or feel nauseous.  You have a fever. Get help right away if:  You develop sudden changes in your vision.  You have severe ear pain.  You have new or increased weakness.  You have a severe headache. This information is not intended to replace advice given to you by your health care provider. Make sure you discuss any questions you have with your health care provider. Document Released: 01/02/2005 Document Revised: 06/10/2015 Document Reviewed: 05/20/2014 Elsevier Interactive Patient Education  2018 [employer].

## 2016-12-20 NOTE — Progress Notes (Signed)
Subjective:   Kenneth Mclaughlin is a 71 y.o. male who presents for an Initial Medicare Annual Wellness Visit.  Cardiac Risk Factors include: advanced age (>83men, >74 women);dyslipidemia;male gender    Objective:    Today's Vitals   12/20/16 0931 12/20/16 1027  BP: 130/66   Pulse: (!) 40   Temp: 97.9 F (36.6 C)   TempSrc: Oral   SpO2: 96%   Weight: 224 lb 6.4 oz (101.8 kg)   Height: 6\' 1"  (1.854 m)   PainSc: 3  3   PainLoc: Foot    Body mass index is 29.61 kg/m.  Heart rate verified manually. HR 40 at last OV on 12/14/2016 as well. Patient states feeling well.  Advanced Directives 12/20/2016 12/20/2016 12/14/2016 08/24/2015 05/27/2015 09/16/2014  Does Patient Have a Medical Advance Directive? No No No No No No  Would patient like information on creating a medical advance directive? Yes (MAU/Ambulatory/Procedural Areas - Information given) - No - Patient declined No - patient declined information No - patient declined information No - patient declined information    Current Medications (verified) Outpatient Encounter Medications as of 12/20/2016  Medication Sig  . aspirin 81 MG tablet Take 81 mg by mouth daily.    . citalopram (CELEXA) 20 MG tablet TAKE 1 TABLET BY MOUTH EVERY DAY  . Multiple Vitamin (MULTIVITAMIN) tablet Take 1 tablet by mouth daily.    . pravastatin (PRAVACHOL) 40 MG tablet Take 1 tablet (40 mg total) by mouth every other day.  . vardenafil (LEVITRA) 10 MG tablet Take 1 tablet (10 mg total) by mouth daily as needed for erectile dysfunction.   No facility-administered encounter medications on file as of 12/20/2016.     Allergies (verified) Patient has no known allergies.   History: Past Medical History:  Diagnosis Date  . Atrial fibrillation (HCC)    Post-op. No recurrence.   . Depression   . Hypercholesteremia 04/21/2011  . Mitral regurgitation   . Myxomatous degeneration of mitral valve   . Tobacco abuse    Past Surgical History:  Procedure  Laterality Date  . KNEE ARTHROSCOPY Left 06/2015  . MITRAL VALVE REPAIR  06/14/2010   right minithoracotomy for complex valvuloplasty with 33mm ring annuloplasty - Dr. Roxy Manns  . SHOULDER SURGERY    . TONSILLECTOMY     Family History  Problem Relation Age of Onset  . Heart failure Mother   . Dementia Mother   . Cancer Father        Lung  . COPD Father   . Heart disease Brother   . Heart disease Brother    Social History   Socioeconomic History  . Marital status: Married    Spouse name: None  . Number of children: None  . Years of education: None  . Highest education level: Bachelor's degree (e.g., BA, AB, BS)  Social Needs  . Financial resource strain: None  . Food insecurity - worry: None  . Food insecurity - inability: None  . Transportation needs - medical: None  . Transportation needs - non-medical: None  Occupational History  . None  Tobacco Use  . Smoking status: Former Smoker    Packs/day: 1.00    Years: 8.00    Pack years: 8.00    Types: Cigarettes    Last attempt to quit: 03/14/1977    Years since quitting: 39.7  . Smokeless tobacco: Never Used  Substance and Sexual Activity  . Alcohol use: Yes    Comment: weekly  . Drug  use: No  . Sexual activity: Yes  Other Topics Concern  . None  Social History Narrative   Current Social History 12/20/2016        Patient lives with Wife in one level home 12/20/2016   Transportation: Patient has own vehicle and drives himself 62/07/348   Important Relationships Wife, sons and brothers 12/20/2016    Pets: Lab/boxer mix Publishing rights manager) and Bichon Leotis Pain) 12/20/2016   Education / Work:  BS/ Works at [employer]; standing 8-9 hours per day12/05/2016   Interests / Fun: Being active, art, music (singing), hiking (when not in pain from left foot bunion) 12/20/2016   Current Stressors: Finances 12/20/2016   Religious / Personal Beliefs: "I believe in God." 12/20/2016      L. Aveline Daus, RN, BSN                                                                                                  Tobacco Counseling Patient is former smoker with no plans to restart  Clinical Intake:  Pre-visit preparation completed: Yes  Pain : 0-10 Pain Score: 3  Pain Type: Chronic pain Pain Location: Foot Pain Orientation: Left Pain Radiating Towards: Pain extending up left leg to both hips 2/2 patient walking differently to ease pain from left foot bunion Pain Descriptors / Indicators: Sharp Pain Onset: More than a month ago(5 years) Pain Frequency: Constant Pain Relieving Factors: Shaving bunion Effect of Pain on Daily Activities: Yes, prevents evening walk and hiking  Pain Relieving Factors: Shaving bunion  Nutritional Status: BMI 25 -29 Overweight Diabetes: No  Activities of Daily Living: Independent Ambulation: Independent Medication Administration: Independent Home Management: Independent  Barriers to Care Management & Learning: None  Do you feel unsafe in your current relationship?: No Do you feel physically threatened by others?: No Anyone hurting you at home, work, or school?: No Unable to ask?: No  What is the last grade level you completed in school?: BS  Interpreter Needed?: No     Activities of Daily Living In your present state of health, do you have any difficulty performing the following activities: 12/20/2016  Hearing? N  Vision? N  Difficulty concentrating or making decisions? N  Walking or climbing stairs? N  Dressing or bathing? N  Doing errands, shopping? N  Preparing Food and eating ? N  Using the Toilet? N  In the past six months, have you accidently leaked urine? N  Do you have problems with loss of bowel control? N  Managing your Medications? N  Managing your Finances? N  Housekeeping or managing your Housekeeping? N  Some recent data might be hidden   Home Safety: Patient lives in handicapped-equipped home with wife (No steps, wide doorways, grab bars) My home has a working smoke alarm:   Yes X 3           My home throw rugs have been fastened down to the floor or removed:  Removed I have a non-slip surface or non-slip mats in the bathtub and shower:  Non-slip surface and grab bars        All my  home's stairs have handrails, including any outdoor stairs  One level home with no outside steps          My home's floors, stairs and hallways are free from clutter, wires and cords:  Yes     I have animals in my home  Yes as above I wear seatbelts consistently:  Yes    Immunizations and Health Maintenance Immunization History  Administered Date(s) Administered  . Influenza, High Dose Seasonal PF 03/04/2016, 11/15/2016  . Influenza,inj,Quad PF,6+ Mos 10/02/2012, 09/16/2014  . Pneumococcal Conjugate-13 10/02/2012  . Pneumococcal Polysaccharide-23 09/16/2014  . Tdap 04/21/2011  . Zoster Recombinat (Shingrix) 11/26/2016   Health Maintenance Due  Topic Date Due  . COLONOSCOPY  09/03/1997   Patient is in process of scheduling with [facility]  Patient Care Team: Zenia Resides, MD as PCP - General (Family Medicine) Ellamae Sia., MD as Referring Physician (Sports Medicine) Burnell Blanks, MD as Consulting Physician (Cardiology) Presley Raddle for podiatry Dr Rexanne Mano for dentistry  Indicate any recent Medical Services you may have received from other than [facility] providers in the past year (date may be approximate).    Assessment:   This is a routine wellness examination for Kenneth Mclaughlin.   Hearing/Vision screen  Hearing Screening   Method: Audiometry   125Hz  250Hz  500Hz  1000Hz  2000Hz  3000Hz  4000Hz  6000Hz  8000Hz   Right ear:   Fail Fail Fail  Fail    Left ear:   Fail Fail Fail  Fail     Patient denies difficulty hearing. Has seen ENT in past with normal hearing test  Dietary issues and exercise activities discussed: Current Exercise Habits: The patient has a physically strenous job, but has no regular exercise apart from work.(Stands 8-9 hours daily at work),  Exercise limited by: Other - see comments(Left foot bunion has limited ability to walk in evenings and hike)  Goals    . Contact podiatrist to take care of bunion    . Getting back to hiking and toning (pt-stated)    . Have colonoscopy (pt-stated)      Depression Screen PHQ 2/9 Scores 12/20/2016 12/14/2016 08/24/2015 09/16/2014  PHQ - 2 Score 0 0 0 0    Fall Risk Fall Risk  12/20/2016 12/14/2016 08/24/2015 09/16/2014 10/02/2012  Falls in the past year? No No No No No   TUG Test:  Done in 11 seconds. Falls prevention discussed in detail and literature given.  Cognitive Function: Mini-Cog  Passed with score 4/5  Screening Tests Health Maintenance  Topic Date Due  . COLONOSCOPY  09/03/1997  . TETANUS/TDAP  04/20/2021  . INFLUENZA VACCINE  Completed  . Hepatitis C Screening  Completed  . PNA vac Low Risk Adult  Completed       Plan:    Heart rate verified manually. HR 40 at last OV on 12/14/2016 as well. Patient states feeling well.    I have personally reviewed and noted the following in the patient's chart:   . Medical and social history . Use of alcohol, tobacco or illicit drugs  . Current medications and supplements . Functional ability and status . Nutritional status . Physical activity . Advanced directives . List of other physicians . Hospitalizations, surgeries, and ER visits in previous 12 months . Vitals . Screenings to include cognitive, depression, and falls . Referrals and appointments  In addition, I have reviewed and discussed with patient certain preventive protocols, quality metrics, and best practice recommendations. A written personalized care plan for preventive services as well  as general preventive health recommendations were provided to patient.     Velora Heckler, RN   12/20/2016

## 2016-12-20 NOTE — Progress Notes (Signed)
Patient ID: Kenneth Mclaughlin, male   DOB: [birth year], 71 y.o.   MRN: [id number] I have reviewed this visit and discussed with Howell Rucks, RN, BSN, and agree with her documentation.

## 2016-12-21 ENCOUNTER — Encounter: Payer: Self-pay | Admitting: Family Medicine

## 2016-12-21 LAB — LIPID PANEL
CHOLESTEROL TOTAL: 163 mg/dL (ref 100–199)
Chol/HDL Ratio: 4.3 ratio (ref 0.0–5.0)
HDL: 38 mg/dL — ABNORMAL LOW (ref >39)
LDL Calculated: 108 mg/dL — ABNORMAL HIGH (ref 0–99)
TRIGLYCERIDES: 86 mg/dL (ref 0–149)
VLDL Cholesterol Cal: 17 mg/dL (ref 5–40)

## 2016-12-21 LAB — CMP14+EGFR
A/G RATIO: 1.9 (ref 1.2–2.2)
ALBUMIN: 4.2 g/dL (ref 3.6–4.8)
ALT: 15 IU/L (ref 0–44)
AST: 17 IU/L (ref 0–40)
Alkaline Phosphatase: 71 IU/L (ref 39–117)
BUN/Creatinine Ratio: 16 (ref 10–24)
BUN: 14 mg/dL (ref 8–27)
Bilirubin Total: 0.6 mg/dL (ref 0.0–1.2)
CALCIUM: 9 mg/dL (ref 8.6–10.2)
CO2: 27 mmol/L (ref 20–29)
Chloride: 101 mmol/L (ref 96–106)
Creatinine, Ser: 0.85 mg/dL (ref 0.76–1.27)
GFR calc Af Amer: 103 mL/min/{1.73_m2} (ref >59)
GFR, EST NON AFRICAN AMERICAN: 89 mL/min/{1.73_m2} (ref >59)
GLOBULIN, TOTAL: 2.2 g/dL (ref 1.5–4.5)
Glucose: 100 mg/dL — ABNORMAL HIGH (ref 65–99)
POTASSIUM: 4.4 mmol/L (ref 3.5–5.2)
SODIUM: 141 mmol/L (ref 134–144)
Total Protein: 6.4 g/dL (ref 6.0–8.5)

## 2016-12-21 LAB — TSH: TSH: 4.17 u[IU]/mL (ref 0.450–4.500)

## 2017-01-02 ENCOUNTER — Other Ambulatory Visit: Payer: Self-pay | Admitting: Family Medicine

## 2017-01-15 ENCOUNTER — Encounter: Payer: Self-pay | Admitting: Family Medicine

## 2017-07-25 DIAGNOSIS — M21619 Bunion of unspecified foot: Secondary | ICD-10-CM | POA: Diagnosis not present

## 2017-07-25 DIAGNOSIS — M2012 Hallux valgus (acquired), left foot: Secondary | ICD-10-CM | POA: Diagnosis not present

## 2017-07-25 DIAGNOSIS — M2040 Other hammer toe(s) (acquired), unspecified foot: Secondary | ICD-10-CM | POA: Diagnosis not present

## 2017-11-28 ENCOUNTER — Ambulatory Visit: Payer: PPO | Admitting: Family Medicine

## 2017-12-03 ENCOUNTER — Encounter: Payer: Self-pay | Admitting: Family Medicine

## 2017-12-03 ENCOUNTER — Ambulatory Visit (INDEPENDENT_AMBULATORY_CARE_PROVIDER_SITE_OTHER): Payer: PPO | Admitting: Family Medicine

## 2017-12-03 VITALS — BP 119/79 | HR 84 | Ht 73.0 in | Wt 225.0 lb

## 2017-12-03 DIAGNOSIS — M25561 Pain in right knee: Secondary | ICD-10-CM | POA: Diagnosis not present

## 2017-12-03 NOTE — Patient Instructions (Signed)
Your pain is due to arthritis. These are the different medications you can take for this: Tylenol 500mg  1-2 tabs three times a day for pain. Capsaicin, aspercreme, or biofreeze topically up to four times a day may also help with pain. Some supplements that may help for arthritis: Boswellia extract, curcumin, pycnogenol Aleve 1-2 tabs twice a day with food as needed. Cortisone injections are an option if pain is severe. If cortisone injections do not help, there are different types of shots that may help but they take longer to take effect. It's important that you continue to stay active. Straight leg raises, knee extensions 3 sets of 10 once a day (add ankle weight if these become too easy). Consider physical therapy to strengthen muscles around the joint that hurts to take pressure off of the joint itself. Shoe inserts with good arch support may be helpful. Heat or ice 15 minutes at a time 3-4 times a day as needed to help with pain. Water aerobics and cycling with low resistance are the best two types of exercise for arthritis though any exercise is ok as long as it doesn't worsen the pain. Follow up with me in 6 weeks or as needed.

## 2017-12-03 NOTE — Progress Notes (Signed)
PCP: Zenia Resides, MD  Subjective:   HPI: Patient is a 72 y.o. male here for right knee pain.  Patient reports about 3 weeks ago he started to develop anterior right knee pain. No acute injury or trauma. He stands a lot at work - did seem worse at end of work day. Pain 2/10 but up to 8/10 and sharp at worst. He did stop his glucosamine about the same time his pain started. Past few days has been resting and this has helped. Some swelling. Took tylenol and some tramadol. Worse with stairs, bending knee. Radiates up thigh some and down anterior shin. No skin changes, numbness.  Past Medical History:  Diagnosis Date  . Atrial fibrillation (HCC)    Post-op. No recurrence.   . Depression   . Hypercholesteremia 04/21/2011  . Mitral regurgitation   . Myxomatous degeneration of mitral valve   . Tobacco abuse     Current Outpatient Medications on File Prior to Visit  Medication Sig Dispense Refill  . aspirin 81 MG tablet Take 81 mg by mouth daily.      . citalopram (CELEXA) 20 MG tablet TAKE 1 TABLET BY MOUTH EVERY DAY 90 tablet 3  . Multiple Vitamin (MULTIVITAMIN) tablet Take 1 tablet by mouth daily.      . pravastatin (PRAVACHOL) 40 MG tablet Take 1 tablet (40 mg total) by mouth every other day. 45 tablet 3  . vardenafil (LEVITRA) 10 MG tablet Take 1 tablet (10 mg total) by mouth daily as needed for erectile dysfunction. 3 tablet 0   No current facility-administered medications on file prior to visit.     Past Surgical History:  Procedure Laterality Date  . KNEE ARTHROSCOPY Left 06/2015  . MITRAL VALVE REPAIR  06/14/2010   right minithoracotomy for complex valvuloplasty with 40mm ring annuloplasty - Dr. Roxy Manns  . SHOULDER SURGERY    . TONSILLECTOMY      No Known Allergies  Social History   Socioeconomic History  . Marital status: Married    Spouse name: Not on file  . Number of children: Not on file  . Years of education: Not on file  . Highest education level:  Bachelor's degree (e.g., BA, AB, BS)  Occupational History  . Not on file  Social Needs  . Financial resource strain: Not on file  . Food insecurity:    Worry: Not on file    Inability: Not on file  . Transportation needs:    Medical: Not on file    Non-medical: Not on file  Tobacco Use  . Smoking status: Former Smoker    Packs/day: 1.00    Years: 8.00    Pack years: 8.00    Types: Cigarettes    Last attempt to quit: 03/14/1977    Years since quitting: 40.7  . Smokeless tobacco: Never Used  Substance and Sexual Activity  . Alcohol use: Yes    Comment: weekly  . Drug use: No  . Sexual activity: Yes  Lifestyle  . Physical activity:    Days per week: Not on file    Minutes per session: Not on file  . Stress: Not on file  Relationships  . Social connections:    Talks on phone: Not on file    Gets together: Not on file    Attends religious service: Not on file    Active member of club or organization: Not on file    Attends meetings of clubs or organizations: Not on file  Relationship status: Not on file  . Intimate partner violence:    Fear of current or ex partner: Not on file    Emotionally abused: Not on file    Physically abused: Not on file    Forced sexual activity: Not on file  Other Topics Concern  . Not on file  Social History Narrative   Current Social History 12/20/2016        Patient lives with Wife in one level home 12/20/2016   Transportation: Patient has own vehicle and drives himself 09/23/3530   Important Relationships Wife, sons and brothers 12/20/2016    Pets: Lab/boxer mix Publishing rights manager) and Bichon Leotis Pain) 12/20/2016   Education / Work:  BS/ Works at [employer]; standing 8-9 hours per day12/05/2016   Interests / Fun: Being active, art, music (singing), hiking (when not in pain from left foot bunion) 12/20/2016   Current Stressors: Finances 12/20/2016   Religious / Personal Beliefs: "I believe in God." 12/20/2016      L. Ducatte, RN, BSN                                                                                                   Family History  Problem Relation Age of Onset  . Heart failure Mother   . Dementia Mother   . Cancer Father        Lung  . COPD Father   . Heart disease Brother   . Heart disease Brother     BP 119/79   Pulse 84   Ht 6\' 1"  (1.854 m)   Wt 225 lb (102.1 kg)   BMI 29.69 kg/m   Review of Systems: See HPI above.     Objective:  Physical Exam:  Gen: NAD, comfortable in exam room  Right knee: No gross deformity, ecchymoses.  Mild effusion. No TTP. FROM with 5/5 strength flexion and extension. Negative ant/post drawers. Negative valgus/varus testing. Negative lachmans. Negative mcmurrays, apleys, bounce, patellar apprehension.  Mild pain with thessalys but hurt with bending prior to twisting. NV intact distally.  Left knee: No deformity. FROM with 5/5 strength. No tenderness to palpation. NVI distally.   Assessment & Plan:  1. Right knee pain - exam is reassuring.  Consistent with flare of arthritis.  Discussed tylenol, topical medications, supplements, aleve.  Consider injection if pain becomes severe.  Offered physical therapy - he would like to start with home exercises which were shown.  Heat if needed.  F/u in 6 weeks or as needed.  Consider imaging as well.

## 2017-12-20 ENCOUNTER — Ambulatory Visit (INDEPENDENT_AMBULATORY_CARE_PROVIDER_SITE_OTHER): Payer: PPO | Admitting: Family Medicine

## 2017-12-20 ENCOUNTER — Encounter: Payer: Self-pay | Admitting: Family Medicine

## 2017-12-20 VITALS — BP 122/88 | HR 72 | Ht 73.0 in | Wt 230.0 lb

## 2017-12-20 DIAGNOSIS — M792 Neuralgia and neuritis, unspecified: Secondary | ICD-10-CM | POA: Diagnosis not present

## 2017-12-20 DIAGNOSIS — M25561 Pain in right knee: Secondary | ICD-10-CM | POA: Diagnosis not present

## 2017-12-20 MED ORDER — METHYLPREDNISOLONE ACETATE 40 MG/ML IJ SUSP
40.0000 mg | Freq: Once | INTRAMUSCULAR | Status: AC
  Administered 2017-12-20: 40 mg via INTRA_ARTICULAR

## 2017-12-20 NOTE — Progress Notes (Signed)
PCP: Zenia Resides, MD  Subjective:   HPI: Patient is a 72 y.o. male here for right knee pain.  11/18: Patient reports about 3 weeks ago he started to develop anterior right knee pain. No acute injury or trauma. He stands a lot at work - did seem worse at end of work day. Pain 2/10 but up to 8/10 and sharp at worst. He did stop his glucosamine about the same time his pain started. Past few days has been resting and this has helped. Some swelling. Took tylenol and some tramadol. Worse with stairs, bending knee. Radiates up thigh some and down anterior shin. No skin changes, numbness.  12/4: Patient reports he's still having 2/10 pain that's up to 7/10 at times. Would like to do intraarticular injection.  Past Medical History:  Diagnosis Date  . Atrial fibrillation (HCC)    Post-op. No recurrence.   . Depression   . Hypercholesteremia 04/21/2011  . Mitral regurgitation   . Myxomatous degeneration of mitral valve   . Tobacco abuse     Current Outpatient Medications on File Prior to Visit  Medication Sig Dispense Refill  . aspirin 81 MG tablet Take 81 mg by mouth daily.      . citalopram (CELEXA) 20 MG tablet TAKE 1 TABLET BY MOUTH EVERY DAY 90 tablet 3  . Multiple Vitamin (MULTIVITAMIN) tablet Take 1 tablet by mouth daily.      . pravastatin (PRAVACHOL) 40 MG tablet Take 1 tablet (40 mg total) by mouth every other day. 45 tablet 3  . vardenafil (LEVITRA) 10 MG tablet Take 1 tablet (10 mg total) by mouth daily as needed for erectile dysfunction. 3 tablet 0   No current facility-administered medications on file prior to visit.     Past Surgical History:  Procedure Laterality Date  . KNEE ARTHROSCOPY Left 06/2015  . MITRAL VALVE REPAIR  06/14/2010   right minithoracotomy for complex valvuloplasty with 82mm ring annuloplasty - Dr. Roxy Manns  . SHOULDER SURGERY    . TONSILLECTOMY      No Known Allergies  Social History   Socioeconomic History  . Marital status:  Married    Spouse name: Not on file  . Number of children: Not on file  . Years of education: Not on file  . Highest education level: Bachelor's degree (e.g., BA, AB, BS)  Occupational History  . Not on file  Social Needs  . Financial resource strain: Not on file  . Food insecurity:    Worry: Not on file    Inability: Not on file  . Transportation needs:    Medical: Not on file    Non-medical: Not on file  Tobacco Use  . Smoking status: Former Smoker    Packs/day: 1.00    Years: 8.00    Pack years: 8.00    Types: Cigarettes    Last attempt to quit: 03/14/1977    Years since quitting: 40.7  . Smokeless tobacco: Never Used  Substance and Sexual Activity  . Alcohol use: Yes    Comment: weekly  . Drug use: No  . Sexual activity: Yes  Lifestyle  . Physical activity:    Days per week: Not on file    Minutes per session: Not on file  . Stress: Not on file  Relationships  . Social connections:    Talks on phone: Not on file    Gets together: Not on file    Attends religious service: Not on file    Active  member of club or organization: Not on file    Attends meetings of clubs or organizations: Not on file    Relationship status: Not on file  . Intimate partner violence:    Fear of current or ex partner: Not on file    Emotionally abused: Not on file    Physically abused: Not on file    Forced sexual activity: Not on file  Other Topics Concern  . Not on file  Social History Narrative   Current Social History 12/20/2016        Patient lives with Wife in one level home 12/20/2016   Transportation: Patient has own vehicle and drives himself 38/01/8297   Important Relationships Wife, sons and brothers 12/20/2016    Pets: Lab/boxer mix Publishing rights manager) and Bichon Leotis Pain) 12/20/2016   Education / Work:  BS/ Works at [employer]; standing 8-9 hours per day12/05/2016   Interests / Fun: Being active, art, music (singing), hiking (when not in pain from left foot bunion) 12/20/2016   Current  Stressors: Finances 12/20/2016   Religious / Personal Beliefs: "I believe in God." 12/20/2016      L. Ducatte, RN, BSN                                                                                                  Family History  Problem Relation Age of Onset  . Heart failure Mother   . Dementia Mother   . Cancer Father        Lung  . COPD Father   . Heart disease Brother   . Heart disease Brother     BP 122/88   Pulse 72   Ht 6\' 1"  (1.854 m)   Wt 230 lb (104.3 kg)   BMI 30.34 kg/m   Review of Systems: See HPI above.     Objective:  Physical Exam:  Gen: NAD, comfortable in exam room  Rest of exam not repeated today. Right knee: No gross deformity, ecchymoses.  Mild effusion. No TTP. FROM with 5/5 strength flexion and extension. Negative ant/post drawers. Negative valgus/varus testing. Negative lachmans. Negative mcmurrays, apleys, bounce, patellar apprehension.  Mild pain with thessalys but hurt with bending prior to twisting. NV intact distally.  Left knee: No deformity. FROM with 5/5 strength. No tenderness to palpation. NVI distally.   Assessment & Plan:  1. Right knee pain - 2/2 flare of arthritis.  Intraarticular injection given today.  Previously reviewed home exercises, tylenol, topical medications, supplements, aleve.  Consider physical therapy.  F/u as scheduled in 3-4 weeks.  After informed written consent timeout was performed, patient was seated on exam table. Right knee was prepped with alcohol swab and utilizing anteromedial approach, patient's right knee was injected intraarticularly with 3:1 bupivicaine: depomedrol. Patient tolerated the procedure well without immediate complications.

## 2017-12-25 ENCOUNTER — Telehealth: Payer: Self-pay | Admitting: Family Medicine

## 2017-12-25 NOTE — Telephone Encounter (Signed)
Attempted to contact pt to schedule a health maintenance appt. PCP hasn't seen pt in a while, so I left a voicemail asking them to call back the office to schedule an appt whenever they can. -[place]

## 2017-12-28 ENCOUNTER — Other Ambulatory Visit: Payer: Self-pay | Admitting: Family Medicine

## 2017-12-28 DIAGNOSIS — E785 Hyperlipidemia, unspecified: Secondary | ICD-10-CM

## 2018-02-05 ENCOUNTER — Other Ambulatory Visit: Payer: Self-pay | Admitting: Family Medicine

## 2018-02-05 DIAGNOSIS — E785 Hyperlipidemia, unspecified: Secondary | ICD-10-CM

## 2018-03-09 ENCOUNTER — Other Ambulatory Visit: Payer: Self-pay | Admitting: Family Medicine

## 2018-08-07 ENCOUNTER — Other Ambulatory Visit: Payer: Self-pay | Admitting: Family Medicine

## 2018-08-07 DIAGNOSIS — E785 Hyperlipidemia, unspecified: Secondary | ICD-10-CM

## 2018-08-15 ENCOUNTER — Other Ambulatory Visit: Payer: Self-pay

## 2018-10-02 DIAGNOSIS — M7742 Metatarsalgia, left foot: Secondary | ICD-10-CM | POA: Diagnosis not present

## 2018-10-02 DIAGNOSIS — L84 Corns and callosities: Secondary | ICD-10-CM | POA: Diagnosis not present

## 2018-10-02 DIAGNOSIS — M21612 Bunion of left foot: Secondary | ICD-10-CM | POA: Diagnosis not present

## 2018-10-02 DIAGNOSIS — M2042 Other hammer toe(s) (acquired), left foot: Secondary | ICD-10-CM | POA: Diagnosis not present

## 2018-11-05 ENCOUNTER — Other Ambulatory Visit: Payer: Self-pay | Admitting: Family Medicine

## 2018-11-05 DIAGNOSIS — E785 Hyperlipidemia, unspecified: Secondary | ICD-10-CM

## 2019-01-29 ENCOUNTER — Other Ambulatory Visit: Payer: Self-pay | Admitting: Family Medicine

## 2019-01-29 DIAGNOSIS — E785 Hyperlipidemia, unspecified: Secondary | ICD-10-CM

## 2019-02-05 DIAGNOSIS — L84 Corns and callosities: Secondary | ICD-10-CM | POA: Diagnosis not present

## 2019-02-05 DIAGNOSIS — M21612 Bunion of left foot: Secondary | ICD-10-CM | POA: Diagnosis not present

## 2019-02-05 DIAGNOSIS — M2042 Other hammer toe(s) (acquired), left foot: Secondary | ICD-10-CM | POA: Diagnosis not present

## 2019-02-05 DIAGNOSIS — M7742 Metatarsalgia, left foot: Secondary | ICD-10-CM | POA: Diagnosis not present

## 2019-02-24 DIAGNOSIS — Z20822 Contact with and (suspected) exposure to covid-19: Secondary | ICD-10-CM | POA: Diagnosis not present

## 2019-03-28 ENCOUNTER — Other Ambulatory Visit: Payer: Self-pay | Admitting: Family Medicine

## 2019-04-29 ENCOUNTER — Other Ambulatory Visit: Payer: Self-pay | Admitting: Family Medicine

## 2019-04-29 DIAGNOSIS — E785 Hyperlipidemia, unspecified: Secondary | ICD-10-CM

## 2019-07-29 ENCOUNTER — Other Ambulatory Visit: Payer: Self-pay | Admitting: Family Medicine

## 2019-07-29 DIAGNOSIS — E785 Hyperlipidemia, unspecified: Secondary | ICD-10-CM

## 2019-08-11 ENCOUNTER — Other Ambulatory Visit: Payer: Self-pay

## 2019-08-11 ENCOUNTER — Encounter: Payer: Self-pay | Admitting: Student in an Organized Health Care Education/Training Program

## 2019-08-11 ENCOUNTER — Ambulatory Visit
Admission: RE | Admit: 2019-08-11 | Discharge: 2019-08-11 | Disposition: A | Discharge status: Home / Self Care | Payer: PPO | Source: Ambulatory Visit | Attending: Family Medicine | Admitting: Family Medicine

## 2019-08-11 ENCOUNTER — Ambulatory Visit (INDEPENDENT_AMBULATORY_CARE_PROVIDER_SITE_OTHER): Payer: PPO | Admitting: Student in an Organized Health Care Education/Training Program

## 2019-08-11 VITALS — BP 102/70 | HR 93 | Wt 225.0 lb

## 2019-08-11 DIAGNOSIS — L02611 Cutaneous abscess of right foot: Secondary | ICD-10-CM

## 2019-08-11 DIAGNOSIS — M7989 Other specified soft tissue disorders: Secondary | ICD-10-CM | POA: Diagnosis not present

## 2019-08-11 DIAGNOSIS — L089 Local infection of the skin and subcutaneous tissue, unspecified: Secondary | ICD-10-CM | POA: Diagnosis not present

## 2019-08-11 DIAGNOSIS — Z131 Encounter for screening for diabetes mellitus: Secondary | ICD-10-CM

## 2019-08-11 DIAGNOSIS — M79674 Pain in right toe(s): Secondary | ICD-10-CM | POA: Diagnosis not present

## 2019-08-11 LAB — POCT GLYCOSYLATED HEMOGLOBIN (HGB A1C): Hemoglobin A1C: 5.4 % (ref 4.0–5.6)

## 2019-08-11 MED ORDER — CEPHALEXIN 500 MG PO CAPS: 500.0000 mg | ORAL_CAPSULE | Freq: Two times a day (BID) | ORAL | 0 refills | Status: AC

## 2019-08-11 NOTE — Patient Instructions (Addendum)
It was a pleasure to see you today!  To summarize our discussion for this visit:  Your toe appears to have an infection which we are treating partly with removing the infected skin and with an antibiotic for a week.  We are also ordering.   Please go to [facility] at [address] for toe xray today. I'll contact you with the results.   And tell Dr. Mingo Amber I said "hello!"  Some additional health maintenance measures we should update are: Health Maintenance Due  Topic Date Due  . COVID-19 Vaccine (1) Never done  . COLONOSCOPY  Never done  .   Please return to our clinic to see [place] in 1 week.  Call the clinic at [phone] if your symptoms worsen or you have any concerns.   Thank you for allowing me to take part in your care,  Dr. Doristine Mango

## 2019-08-11 NOTE — Progress Notes (Signed)
° °  SUBJECTIVE:   CHIEF COMPLAINT / HPI: toe infection  Patient first noticed R third toe blister approximately 3 weeks ago.  Since that time the lesion has broken open and bled but has not noticed any frank pus.  The surrounding toe tissue has become red and painful with touch and ambulation.  Denies any spreading redness, rash, fever.  Does not recall any specific injury to that toe.  Does not have history of diabetes.  OBJECTIVE:   BP 102/70    Pulse 93    Wt (!) 225 lb (102.1 kg)    SpO2 95%    BMI 29.69 kg/m   General: NAD, pleasant, able to participate in exam R third digit of foot: thick callous with fried blood and macerated inferior edge. Erythema to distal digit and tenderness to palpation. No fluctuation felt on initial exam. Patient able to flex toe without pain and ambulate normally. Removal of callus was performed by Dr. Andria Frames which revealed a pocket of bloody exudate within the soft tissue of the toe pad. The pus was expressed during the procedure and the post-procedure result is seen in the clinical image below. Neuro: alert and oriented x4, no focal deficits Psych: Normal affect and mood     ASSESSMENT/PLAN:   Toe abscess, right - expressed with removal of callous - keflex x7 days - xray showed no osseous involvement - f/u in 1 week or sooner if worsening or fevering.  - would consider podiatry referral - A1c neg for diabetes.    [place]

## 2019-08-14 DIAGNOSIS — L02611 Cutaneous abscess of right foot: Secondary | ICD-10-CM | POA: Insufficient documentation

## 2019-08-14 NOTE — Assessment & Plan Note (Addendum)
-   expressed with removal of callous - keflex x7 days - xray showed no osseous involvement - f/u in 1 week or sooner if worsening or fevering.  - would consider podiatry referral - A1c neg for diabetes.

## 2019-08-18 ENCOUNTER — Ambulatory Visit: Payer: PPO | Admitting: Family Medicine

## 2019-08-25 ENCOUNTER — Ambulatory Visit: Payer: PPO | Admitting: Family Medicine

## 2019-09-10 ENCOUNTER — Other Ambulatory Visit: Payer: Self-pay

## 2019-09-10 ENCOUNTER — Ambulatory Visit (INDEPENDENT_AMBULATORY_CARE_PROVIDER_SITE_OTHER): Payer: PPO | Admitting: Family Medicine

## 2019-09-10 ENCOUNTER — Encounter: Payer: Self-pay | Admitting: Family Medicine

## 2019-09-10 DIAGNOSIS — Z0000 Encounter for general adult medical examination without abnormal findings: Secondary | ICD-10-CM | POA: Diagnosis not present

## 2019-09-10 DIAGNOSIS — Z1211 Encounter for screening for malignant neoplasm of colon: Secondary | ICD-10-CM | POA: Diagnosis not present

## 2019-09-10 DIAGNOSIS — Z2989 Encounter for other specified prophylactic measures: Secondary | ICD-10-CM

## 2019-09-10 DIAGNOSIS — E785 Hyperlipidemia, unspecified: Secondary | ICD-10-CM

## 2019-09-10 DIAGNOSIS — Z9889 Other specified postprocedural states: Secondary | ICD-10-CM | POA: Diagnosis not present

## 2019-09-10 DIAGNOSIS — Z298 Encounter for other specified prophylactic measures: Secondary | ICD-10-CM | POA: Diagnosis not present

## 2019-09-10 DIAGNOSIS — L989 Disorder of the skin and subcutaneous tissue, unspecified: Secondary | ICD-10-CM | POA: Diagnosis not present

## 2019-09-10 NOTE — Patient Instructions (Addendum)
Come back at your convenience to biopsy that spot on your chest. Yes, when you can arrange it, I would still like you to have a colonoscopy.  Please do the poop cards until we can arrange the colonoscopy. Stop taking the aspirin.  After 70, you are more likely to have harm rather than benefit. I will call with lab test results.   Let us know when you get flu shot.

## 2019-09-11 ENCOUNTER — Encounter: Payer: Self-pay | Admitting: Family Medicine

## 2019-09-11 DIAGNOSIS — L989 Disorder of the skin and subcutaneous tissue, unspecified: Secondary | ICD-10-CM | POA: Insufficient documentation

## 2019-09-11 LAB — CMP14+EGFR
ALT: 15 IU/L (ref 0–44)
AST: 12 IU/L (ref 0–40)
Albumin/Globulin Ratio: 2.5 — ABNORMAL HIGH (ref 1.2–2.2)
Albumin: 4.5 g/dL (ref 3.7–4.7)
Alkaline Phosphatase: 78 IU/L (ref 48–121)
BUN/Creatinine Ratio: 15 (ref 10–24)
BUN: 13 mg/dL (ref 8–27)
Bilirubin Total: 0.5 mg/dL (ref 0.0–1.2)
CO2: 24 mmol/L (ref 20–29)
Calcium: 9 mg/dL (ref 8.6–10.2)
Chloride: 102 mmol/L (ref 96–106)
Creatinine, Ser: 0.88 mg/dL (ref 0.76–1.27)
GFR calc Af Amer: 99 mL/min/{1.73_m2} (ref >59)
GFR calc non Af Amer: 86 mL/min/{1.73_m2} (ref >59)
Globulin, Total: 1.8 g/dL (ref 1.5–4.5)
Glucose: 97 mg/dL (ref 65–99)
Potassium: 4.1 mmol/L (ref 3.5–5.2)
Sodium: 139 mmol/L (ref 134–144)
Total Protein: 6.3 g/dL (ref 6.0–8.5)

## 2019-09-11 LAB — CBC
Hematocrit: 44.4 % (ref 37.5–51.0)
Hemoglobin: 15.2 g/dL (ref 13.0–17.7)
MCH: 32.1 pg (ref 26.6–33.0)
MCHC: 34.2 g/dL (ref 31.5–35.7)
MCV: 94 fL (ref 79–97)
Platelets: 170 10*3/uL (ref 150–450)
RBC: 4.74 x10E6/uL (ref 4.14–5.80)
RDW: 12.6 % (ref 11.6–15.4)
WBC: 7.2 10*3/uL (ref 3.4–10.8)

## 2019-09-11 LAB — LIPID PANEL
Chol/HDL Ratio: 5 ratio (ref 0.0–5.0)
Cholesterol, Total: 155 mg/dL (ref 100–199)
HDL: 31 mg/dL — ABNORMAL LOW (ref >39)
LDL Chol Calc (NIH): 89 mg/dL (ref 0–99)
Triglycerides: 204 mg/dL — ABNORMAL HIGH (ref 0–149)
VLDL Cholesterol Cal: 35 mg/dL (ref 5–40)

## 2019-09-11 NOTE — Progress Notes (Signed)
    SUBJECTIVE:   CHIEF COMPLAINT / HPI:   Annual exam. Acute problems Recently seen for toe infection.  Patient believes it has healed and infection is fully cleared. Skin lesion on anterior chest he woulld like me to look at.  It is growing slowly and has some pigment. Situational: Wife has had falls with injury (broken arm.)  In rehab since March.  Not going well.  He believes she is developing dementia which dramatically slows the rehab.  He is sad but coping. Chronic problems S/P MVR.  No CP or DOE or orthopnea High cholesterol.  Had Minimal CAD on cath at the time of MVR.  On aspirin and statin.  I conceptualize as primary prevention.   Remote HX of A fib.  Only during MVR.  None recent.  No anticoag. HPDP: Has had COVID vaccinations.  Has not had colonoscopy.  With his wife's illness and no other family in town, he has no one to escort him to GI for the procedure.    PERTINENT  PMH / PSH: No at risk behaviors.  Denies bleeding or focal neuro deficits.  Denies change in bowel, bladder, appetite or weight.  OBJECTIVE:   BP 102/62   Pulse (!) 54   Wt 228 lb 6.4 oz (103.6 kg)   SpO2 96%   BMI 30.13 kg/m   HEENT normal. Neck supple without thyromegally or nodes. Lungs clear Cardiac RRR without m or g Abd beni\gn Ext no edema Skin: 1.5 cm irregular bordered flat lesion on left anterior chest with speckled pigmentation. Neuro, motor sensory gait and cognition all grossly normal.  ASSESSMENT/PLAN:   Routine general medical examination at a health care facility Healthy male with no at risk behaviors.   Need for SBE (subacute bacterial endocarditis) prophylaxis He continues to get SBE prophylaxis for dental procedures.  S/P mitral valve repair Doing well, without symptoms.  Hyperlipidemia I conceptualize as primary prevention.  Hence, LDL level is OK and I have stopped his daily ASA.  Colon cancer screening He agrees to FIT testing since he cannot get a colonoscopy  anytime soon.  Cards given.  Skin lesion of chest wall Likely pigmented sebk but will biopsy in off chance it is melanoma.     Zenia Resides, MD [place]

## 2019-09-11 NOTE — Assessment & Plan Note (Signed)
Healthy male with no at risk behaviors. 

## 2019-09-11 NOTE — Assessment & Plan Note (Signed)
Likely pigmented sebk but will biopsy in off chance it is melanoma.

## 2019-09-11 NOTE — Assessment & Plan Note (Signed)
He agrees to FIT testing since he cannot get a colonoscopy anytime soon.  Cards given.

## 2019-09-11 NOTE — Assessment & Plan Note (Signed)
I conceptualize as primary prevention.  Hence, LDL level is OK and I have stopped his daily ASA.

## 2019-09-11 NOTE — Assessment & Plan Note (Signed)
He continues to get SBE prophylaxis for dental procedures.

## 2019-09-11 NOTE — Assessment & Plan Note (Signed)
Doing well, without symptoms.

## 2019-10-20 ENCOUNTER — Other Ambulatory Visit: Payer: Self-pay | Admitting: Family Medicine

## 2019-10-20 DIAGNOSIS — E785 Hyperlipidemia, unspecified: Secondary | ICD-10-CM

## 2019-12-08 ENCOUNTER — Other Ambulatory Visit: Payer: Self-pay

## 2019-12-08 ENCOUNTER — Encounter: Payer: Self-pay | Admitting: Family Medicine

## 2019-12-08 ENCOUNTER — Ambulatory Visit (INDEPENDENT_AMBULATORY_CARE_PROVIDER_SITE_OTHER): Payer: PPO | Admitting: Family Medicine

## 2019-12-08 DIAGNOSIS — D485 Neoplasm of uncertain behavior of skin: Secondary | ICD-10-CM | POA: Diagnosis not present

## 2019-12-08 DIAGNOSIS — L570 Actinic keratosis: Secondary | ICD-10-CM

## 2019-12-08 NOTE — Patient Instructions (Signed)
The wrist skin blemish that I froze is called an actinic keratosis.  It will blister up and hopefully be replaced by normal skin.   I will call with the results of the skin biopsy I did on your chest.  I likely will not have the results until next week.  I have a low suspicion that it is serious. Of course, call if any signs of infection.

## 2019-12-08 NOTE — Assessment & Plan Note (Addendum)
Skin biopsy of pigmented chest lesion done. Right ant chest

## 2019-12-09 ENCOUNTER — Encounter: Payer: Self-pay | Admitting: Family Medicine

## 2019-12-09 DIAGNOSIS — L814 Other melanin hyperpigmentation: Secondary | ICD-10-CM | POA: Diagnosis not present

## 2019-12-09 DIAGNOSIS — L570 Actinic keratosis: Secondary | ICD-10-CM | POA: Insufficient documentation

## 2019-12-09 NOTE — Progress Notes (Signed)
    SUBJECTIVE:   CHIEF COMPLAINT / HPI:   Two skin lesions: During his annual exam on 8/25, Kenneth Mclaughlin showed me a skin lesion on his chest.  Because it was changing and pigmented, I recommended biopsy.  He is here for that biopsy  Second skin lesion on dorsum of left wrist.  It is scaly and has been present for ~1 year.  Originally he thought it might be caused from contact with a watch.  He has changed watches without the skin lesion resolving.  He does not get much sun now but his lifetime sun exposure is high.     OBJECTIVE:   BP 104/60   Pulse 80   Ht 6\' 1"  (1.854 m)   Wt 215 lb 3.2 oz (97.6 kg)   SpO2 96%   BMI 28.39 kg/m   Left dorsum of wrist.  ~1 cm irregular scaly lesion consistent with AK.  Lesions was treated with cryotherapy  Right ant chest lesion has irregular border and spotty pigmentation.  Informed consent obtained.  Xylocaine with epi anesthesia.  3 mm punch biopsy done.  Patient tolerated well.  Specimen sent for path.  ASSESSMENT/PLAN:   Neoplasm of uncertain behavior of skin of chest Skin biopsy of pigmented chest lesion done.     Zenia Resides, MD [place]

## 2019-12-09 NOTE — Assessment & Plan Note (Signed)
Left wrist.  Treated with cryotherapy.

## 2019-12-22 ENCOUNTER — Ambulatory Visit (INDEPENDENT_AMBULATORY_CARE_PROVIDER_SITE_OTHER): Payer: PPO | Admitting: Family Medicine

## 2019-12-22 ENCOUNTER — Other Ambulatory Visit: Payer: Self-pay

## 2019-12-22 DIAGNOSIS — D485 Neoplasm of uncertain behavior of skin: Secondary | ICD-10-CM

## 2019-12-22 DIAGNOSIS — L988 Other specified disorders of the skin and subcutaneous tissue: Secondary | ICD-10-CM | POA: Diagnosis not present

## 2019-12-22 NOTE — Patient Instructions (Signed)
You have 5 stitches.  See me in one week for removal. I will call with the pathology report. Watch for signs of infection.  Keep clean, covered and use neosporin over the incision.

## 2019-12-23 ENCOUNTER — Encounter: Payer: Self-pay | Admitting: Family Medicine

## 2019-12-23 NOTE — Progress Notes (Signed)
    SUBJECTIVE:   CHIEF COMPLAINT / HPI:   For excisional biopsy of right anterior chest lesion.  Patient had an equivocal punch biopsy 2 weeks ago.  When I discussed those results with him, he desired excisional biopsy to eliminate doubt.    OBJECTIVE:   BP 106/64   Pulse 78   Ht 6\' 1"  (1.854 m)   Wt 214 lb (97.1 kg)   SpO2 95%   BMI 28.23 kg/m   Informed consent obtained. 1-1.5 roughly circular lesion with spotty pigment on anterior right chest. Xylocaine with epi anesthesia. Eliptical full thickness excision made planning 2 mm margin on each side.  Specimen removed intact and sent for path.  Closed with #5 3-0 Nylon sutures.  Minimal blood loss.  Patient left clinic in good condition.  Return 1 week for suture removal.  ASSESSMENT/PLAN:   No problem-specific Assessment & Plan notes found for this encounter.     Zenia Resides, MD [place]

## 2019-12-23 NOTE — Assessment & Plan Note (Signed)
Excisional biopsy done. Return 1 week for suture removal.  Watch for bleeding or signs of infection.

## 2019-12-29 ENCOUNTER — Ambulatory Visit (INDEPENDENT_AMBULATORY_CARE_PROVIDER_SITE_OTHER): Payer: PPO | Admitting: Family Medicine

## 2019-12-29 ENCOUNTER — Other Ambulatory Visit: Payer: Self-pay

## 2019-12-29 ENCOUNTER — Encounter: Payer: Self-pay | Admitting: Family Medicine

## 2019-12-29 DIAGNOSIS — D485 Neoplasm of uncertain behavior of skin: Secondary | ICD-10-CM

## 2019-12-29 NOTE — Assessment & Plan Note (Signed)
FU exam normal, sutures removed.  No additional FU needed.

## 2019-12-29 NOTE — Progress Notes (Signed)
    SUBJECTIVE:   CHIEF COMPLAINT / HPI:   For suture removal. Skin lesion excised from Left anterior chest.  Path reports clear margins and no malignancy.  Patient states no bleeding or redness    OBJECTIVE:   BP 118/60   Pulse (!) 42   Ht 6\' 1"  (1.854 m)   Wt 216 lb (98 kg)   SpO2 97%   BMI 28.50 kg/m   Incision healing well.  5 sutures removed without dificulty.    ASSESSMENT/PLAN:   No problem-specific Assessment & Plan notes found for this encounter.     Zenia Resides, MD [place]

## 2020-07-27 DIAGNOSIS — Z20822 Contact with and (suspected) exposure to covid-19: Secondary | ICD-10-CM | POA: Diagnosis not present

## 2020-07-27 DIAGNOSIS — J069 Acute upper respiratory infection, unspecified: Secondary | ICD-10-CM | POA: Diagnosis not present

## 2020-07-29 DIAGNOSIS — U071 COVID-19: Secondary | ICD-10-CM | POA: Diagnosis not present

## 2020-10-15 ENCOUNTER — Other Ambulatory Visit: Payer: Self-pay | Admitting: Family Medicine

## 2020-10-15 DIAGNOSIS — E785 Hyperlipidemia, unspecified: Secondary | ICD-10-CM

## 2020-12-29 ENCOUNTER — Ambulatory Visit (INDEPENDENT_AMBULATORY_CARE_PROVIDER_SITE_OTHER): Payer: PPO | Admitting: Family Medicine

## 2020-12-29 ENCOUNTER — Other Ambulatory Visit: Payer: Self-pay

## 2020-12-29 ENCOUNTER — Encounter: Payer: Self-pay | Admitting: Family Medicine

## 2020-12-29 DIAGNOSIS — I499 Cardiac arrhythmia, unspecified: Secondary | ICD-10-CM

## 2020-12-29 DIAGNOSIS — E785 Hyperlipidemia, unspecified: Secondary | ICD-10-CM

## 2020-12-29 DIAGNOSIS — R002 Palpitations: Secondary | ICD-10-CM | POA: Diagnosis not present

## 2020-12-29 DIAGNOSIS — Z0000 Encounter for general adult medical examination without abnormal findings: Secondary | ICD-10-CM

## 2020-12-29 HISTORY — DX: Cardiac arrhythmia, unspecified: I49.9

## 2020-12-29 MED ORDER — APIXABAN 5 MG PO TABS: 5.0000 mg | ORAL_TABLET | Freq: Two times a day (BID) | ORAL | 3 refills | Status: DC

## 2020-12-29 NOTE — Assessment & Plan Note (Addendum)
Asymptomatic.  Only discovered on exam. EKG rhythm difficult to determine.  I will treat as a fib and start eliquis.  Placed cards referral.  Not urgent since he is asymptomatic and rate controled.  Also check TSH and lytes.

## 2020-12-29 NOTE — Assessment & Plan Note (Signed)
Healthy male with no at risk behaviors.  Desires FIT testing for colon cancer screen.  He cannot leave his wife long enough for a colonoscopy.

## 2020-12-29 NOTE — Progress Notes (Addendum)
° ° °  SUBJECTIVE:   CHIEF COMPLAINT / HPI:   Annual exam: Physically, Mr. Sar is doing well.  He feels good and is exercising.  He was delighted to travel to [place] for his son's wedding last summer. He is the primary caregiver for his wife, Danton Clap, who has dementia and is non ambulatory, life is full of challenges. Acute problems  Palpitations/irregular heart rhythm.  Patient had no complaints.  I document these pertinent negatives only after I discovered asymptomatic irregular irregular rhythm on cardiac exam.  Denies chest pain, palpitations, fluttering, DOE, ankle swelling.  He was fully unaware of irregular rhythm Chronic problems:  Remote mitral valve replacement.  Very stable.  Released by cards about 5 years ago Hyperlipidemia.  On statin.  Due for lipid panel HPDP.  Up to date.  Got flu and bivalent covid elsewhere.  PERTINENT  PMH / PSH:  Denies abd pain, bleeding, worrisome skin lesions, weakness or numbness.  Denies change in bowel, bladder, appetite.  Has some intentional weight loss. OBJECTIVE:   BP 128/65    Pulse 82    Ht 6\' 1"  (1.854 m)    Wt 208 lb 12.8 oz (94.7 kg)    SpO2 98%    BMI 27.55 kg/m   HEENT wNL Lungs clear Cardiac: irregular irregular rhythm. Abd benign Ext WNL, no edema Neuro: Motor, sensory, gait, cognition and affect all normal.  ASSESSMENT/PLAN:   Hyperlipidemia Lipid panel LDL up.  Called and discussed.  He had myalgias on daily pravastatin. Will switch to low dose rosuvastatin.  If tolerates, recheck direct LDL in 3 months.  Irregularly irregular cardiac rhythm Asymptomatic.  Only discovered on exam. EKG rhythm difficult to determine.  I will treat as a fib and start eliquis.  Placed cards referral.  Not urgent since he is asymptomatic and rate controled.  Also check TSH and lytes.  Routine general medical examination at a health care facility Healthy male with no at risk behaviors.  Desires FIT testing for colon cancer screen.  He cannot  leave his wife long enough for a colonoscopy.    Zenia Resides, MD [place]

## 2020-12-29 NOTE — Assessment & Plan Note (Addendum)
Lipid panel LDL up.  Called and discussed.  He had myalgias on daily pravastatin. Will switch to low dose rosuvastatin.  If tolerates, recheck direct LDL in 3 months.

## 2020-12-29 NOTE — Patient Instructions (Signed)
You look great except for the irregular heart beat.  I started you on a blood thinner and referred you to cardiology.  It looks like a fib to me and I will admit that I am not sure. I also put in a cardiology referral.  Someone should call. I will call with the blood test results.

## 2020-12-30 LAB — CBC
Hematocrit: 41 % (ref 37.5–51.0)
Hemoglobin: 14.9 g/dL (ref 13.0–17.7)
MCH: 33.3 pg — ABNORMAL HIGH (ref 26.6–33.0)
MCHC: 36.3 g/dL — ABNORMAL HIGH (ref 31.5–35.7)
MCV: 92 fL (ref 79–97)
Platelets: 161 10*3/uL (ref 150–450)
RBC: 4.48 x10E6/uL (ref 4.14–5.80)
RDW: 11.9 % (ref 11.6–15.4)
WBC: 6.6 10*3/uL (ref 3.4–10.8)

## 2020-12-30 LAB — CMP14+EGFR
ALT: 16 IU/L (ref 0–44)
AST: 16 IU/L (ref 0–40)
Albumin/Globulin Ratio: 2.1 (ref 1.2–2.2)
Albumin: 4.2 g/dL (ref 3.7–4.7)
Alkaline Phosphatase: 83 IU/L (ref 44–121)
BUN/Creatinine Ratio: 11 (ref 10–24)
BUN: 12 mg/dL (ref 8–27)
Bilirubin Total: 0.7 mg/dL (ref 0.0–1.2)
CO2: 28 mmol/L (ref 20–29)
Calcium: 9.1 mg/dL (ref 8.6–10.2)
Chloride: 100 mmol/L (ref 96–106)
Creatinine, Ser: 1.06 mg/dL (ref 0.76–1.27)
Globulin, Total: 2 g/dL (ref 1.5–4.5)
Glucose: 102 mg/dL — ABNORMAL HIGH (ref 70–99)
Potassium: 4.3 mmol/L (ref 3.5–5.2)
Sodium: 141 mmol/L (ref 134–144)
Total Protein: 6.2 g/dL (ref 6.0–8.5)
eGFR: 74 mL/min/{1.73_m2} (ref >59)

## 2020-12-30 LAB — TSH: TSH: 2.41 u[IU]/mL (ref 0.450–4.500)

## 2020-12-30 LAB — LIPID PANEL
Chol/HDL Ratio: 4.6 ratio (ref 0.0–5.0)
Cholesterol, Total: 175 mg/dL (ref 100–199)
HDL: 38 mg/dL — ABNORMAL LOW (ref >39)
LDL Chol Calc (NIH): 113 mg/dL — ABNORMAL HIGH (ref 0–99)
Triglycerides: 135 mg/dL (ref 0–149)
VLDL Cholesterol Cal: 24 mg/dL (ref 5–40)

## 2020-12-30 MED ORDER — ROSUVASTATIN CALCIUM 10 MG PO TABS: 10.0000 mg | ORAL_TABLET | Freq: Every day | ORAL | 12 refills | Status: DC

## 2020-12-30 NOTE — Addendum Note (Signed)
Addended by: Zenia Resides on: 12/30/2020 12:22 PM   Modules accepted: Orders

## 2021-01-13 ENCOUNTER — Other Ambulatory Visit: Payer: Self-pay | Admitting: Family Medicine

## 2021-01-13 DIAGNOSIS — E785 Hyperlipidemia, unspecified: Secondary | ICD-10-CM

## 2021-01-23 NOTE — Progress Notes (Signed)
Chief Complaint  Patient presents with   New Patient (Initial Visit)    Mitral valve disease   History of Present Illness: 76 yo male with history of CAD, severe MR s/p mitral valve repair here today to re-establish cardiology care. He has not been seen in our office since 2017. He was found to have severe MR April 2012. He underwent cardiac cath 05/23/10 which showed mild non-obstructive CAD and mild LV dysfunction with EF of 50%. TEE 05/13/10 with evidence of flail anterior mitral leaflet. He has undergone mitral valve repair per Dr. Roxy Manns May 2012. He had atrial fibrillation post-op and was started on amiodarone and coumadin but both were stopped in September 2012. Echo February 2017 with LVEF=55-60%, appropriate appearance of repaired mitral valve. He was diagnosed with possible atrial fibrillation in primary care 12/29/20 and is now on Eliquis. I cannot see that EKG. He had no palpitations before or after that primary care visit.   He is here today for follow up. The patient denies any chest pain, dyspnea, palpitations, lower extremity edema, orthopnea, PND, dizziness, near syncope or syncope.   Primary Care Physician: Zenia Resides, MD   Past Medical History:  Diagnosis Date   Atrial fibrillation ([place])    Post-op. No recurrence.    Depression    Hypercholesteremia 04/21/2011   Mitral regurgitation    Myxomatous degeneration of mitral valve    Tobacco abuse     Past Surgical History:  Procedure Laterality Date   KNEE ARTHROSCOPY Left 06/2015   MITRAL VALVE REPAIR  06/14/2010   right minithoracotomy for complex valvuloplasty with 38mm ring annuloplasty - Dr. Roxy Manns   SHOULDER SURGERY     TONSILLECTOMY      Current Outpatient Medications  Medication Sig Dispense Refill   Multiple Vitamin (MULTIVITAMIN) tablet Take 1 tablet by mouth daily.       rosuvastatin (CRESTOR) 10 MG tablet Take 1 tablet (10 mg total) by mouth daily. 30 tablet 12   apixaban (ELIQUIS) 5 MG TABS tablet  Take 1 tablet (5 mg total) by mouth 2 (two) times daily. 60 tablet 6   No current facility-administered medications for this visit.    No Known Allergies  Social History   Socioeconomic History   Marital status: Married    Spouse name: Not on file   Number of children: Not on file   Years of education: Not on file   Highest education level: Bachelor's degree (e.g., BA, AB, BS)  Occupational History   Not on file  Tobacco Use   Smoking status: Former    Packs/day: 1.00    Years: 8.00    Pack years: 8.00    Types: Cigarettes    Quit date: 03/14/1977    Years since quitting: 43.8   Smokeless tobacco: Never  Vaping Use   Vaping Use: Never used  Substance and Sexual Activity   Alcohol use: Yes    Comment: weekly   Drug use: No   Sexual activity: Yes  Other Topics Concern   Not on file  Social History Narrative   Current Social History 12/20/2016        Patient lives with Wife in one level home 12/20/2016   Transportation: Patient has own vehicle and drives himself 70/04/8887   Important Relationships Wife, sons and brothers 12/20/2016    Pets: Lab/boxer mix Publishing rights manager) and Bichon Leotis Pain) 12/20/2016   Education / Work:  BS/ Works at [employer]; standing 8-9 hours per day12/05/2016   Interests /  Fun: Being active, art, music (singing), hiking (when not in pain from left foot bunion) 12/20/2016   Current Stressors: Finances 12/20/2016   Religious / Personal Beliefs: "I believe in God." 12/20/2016      L. Ducatte, RN, BSN                                                                                                 Social Determinants of Health   Financial Resource Strain: Not on file  Food Insecurity: Not on file  Transportation Needs: Not on file  Physical Activity: Not on file  Stress: Not on file  Social Connections: Not on file  Intimate Partner Violence: Not on file    Family History  Problem Relation Age of Onset   Heart failure Mother    Dementia Mother    Cancer  Father        Lung   COPD Father    Heart disease Brother    Heart disease Brother     Review of Systems:  As stated in the HPI and otherwise negative.   BP (!) 98/50    Pulse 87    Ht 6\' 1"  (1.854 m)    Wt 204 lb 6.4 oz (92.7 kg)    SpO2 98%    BMI 26.97 kg/m   Physical Examination: General: Well developed, well nourished, NAD  HEENT: OP clear, mucus membranes moist  SKIN: warm, dry. No rashes. Neuro: No focal deficits  Musculoskeletal: Muscle strength 5/5 all ext  Psychiatric: Mood and affect normal  Neck: No JVD, no carotid bruits, no thyromegaly, no lymphadenopathy.  Lungs:Clear bilaterally, no wheezes, rhonci, crackles Cardiovascular: Irreg irreg. No murmurs, gallops or rubs. Abdomen:Soft. Bowel sounds present. Non-tender.  Extremities: No lower extremity edema. Pulses are 2 + in the bilateral DP/PT.  EKG:  EKG is ordered today. The ekg ordered today demonstrates Atrial fib, rate 87 bpm. PVCs  Recent Labs: 12/29/2020: ALT 16; BUN 12; Creatinine, Ser 1.06; Hemoglobin 14.9; Platelets 161; Potassium 4.3; Sodium 141; TSH 2.410    Wt Readings from Last 3 Encounters:  01/24/21 204 lb 6.4 oz (92.7 kg)  12/29/20 208 lb 12.8 oz (94.7 kg)  12/29/19 216 lb (98 kg)     Other studies Reviewed: Additional studies/ records that were reviewed today include: . Review of the above records demonstrates:    Assessment and Plan:   1. S/P mitral valve repair:  Stable post MV repair. He has no murmur on exam. Echo in 2017 with normally functioning mitral valve repair. Repeat echo now. He will need to take antibiotics before dental procedures.    2. Paroxysmal atrial fibrillation: Remote episode in 2012 but now back in atrial fib. CHADS VASC score of 1. Continue Eliquis. He is asymptomatic. Rate is ok. Will not start a rate control agent given his overall soft BP.     Current medicines are reviewed at length with the patient today.  The patient does not have concerns regarding  medicines.  The following changes have been made:  no change  Labs/ tests ordered today include:   Orders Placed  This Encounter  Procedures   EKG 12-Lead   ECHOCARDIOGRAM COMPLETE    Disposition:   FU with me in 12  months  Signed, Lauree Chandler, MD 01/24/2021 10:27 AM    [facility] [place], [place], [place]  [address] Phone: [phone]; Fax: [phone]

## 2021-01-24 ENCOUNTER — Encounter: Payer: Self-pay | Admitting: Cardiovascular Disease

## 2021-01-24 ENCOUNTER — Other Ambulatory Visit: Payer: Self-pay

## 2021-01-24 ENCOUNTER — Ambulatory Visit: Payer: PPO | Admitting: Cardiovascular Disease

## 2021-01-24 VITALS — BP 98/50 | HR 87 | Ht 73.0 in | Wt 204.4 lb

## 2021-01-24 DIAGNOSIS — I059 Rheumatic mitral valve disease, unspecified: Secondary | ICD-10-CM

## 2021-01-24 DIAGNOSIS — I480 Paroxysmal atrial fibrillation: Secondary | ICD-10-CM

## 2021-01-24 DIAGNOSIS — R002 Palpitations: Secondary | ICD-10-CM | POA: Diagnosis not present

## 2021-01-24 MED ORDER — APIXABAN 5 MG PO TABS: 5.0000 mg | ORAL_TABLET | Freq: Two times a day (BID) | ORAL | 6 refills | Status: DC

## 2021-01-24 NOTE — Patient Instructions (Signed)
Medication Instructions:  No changes *If you need a refill on your cardiac medications before your next appointment, please call your pharmacy*   Lab Work: none   Testing/Procedures: Your physician has requested that you have an echocardiogram. Echocardiography is a painless test that uses sound waves to create images of your heart. It provides your doctor with information about the size and shape of your heart and how well your hearts chambers and valves are working. This procedure takes approximately one hour. There are no restrictions for this procedure.   Follow-Up: At [facility], you and your health needs are our priority.  As part of our continuing mission to provide you with exceptional heart care, we have created designated Provider Care Teams.  These Care Teams include your primary Cardiologist (physician) and Advanced Practice Providers (APPs -  Physician Assistants and Nurse Practitioners) who all work together to provide you with the care you need, when you need it.   Your next appointment:   6 month(s)  The format for your next appointment:   In Person  Provider:   Lauree Chandler, MD     Other Instructions

## 2021-02-02 ENCOUNTER — Ambulatory Visit: Payer: PPO | Admitting: Cardiovascular Disease

## 2021-02-08 ENCOUNTER — Other Ambulatory Visit (HOSPITAL_COMMUNITY): Payer: PPO

## 2021-02-10 ENCOUNTER — Other Ambulatory Visit: Payer: Self-pay

## 2021-02-10 ENCOUNTER — Ambulatory Visit (HOSPITAL_COMMUNITY): Payer: PPO | Attending: Cardiology

## 2021-02-10 DIAGNOSIS — I059 Rheumatic mitral valve disease, unspecified: Secondary | ICD-10-CM | POA: Insufficient documentation

## 2021-02-10 DIAGNOSIS — R002 Palpitations: Secondary | ICD-10-CM | POA: Insufficient documentation

## 2021-02-10 DIAGNOSIS — I480 Paroxysmal atrial fibrillation: Secondary | ICD-10-CM | POA: Diagnosis not present

## 2021-02-10 LAB — ECHOCARDIOGRAM COMPLETE
AR max vel: 3.88 cm2
AV Area VTI: 3.88 cm2
AV Area mean vel: 3.53 cm2
AV Mean grad: 5 mmHg
AV Peak grad: 8.9 mmHg
Ao pk vel: 1.49 m/s
Calc EF: 54.1 %
MV M vel: 3.83 m/s
MV Peak grad: 58.7 mmHg
MV VTI: 1.12 cm2
P 1/2 time: 244 msec
Radius: 0.8 cm
S' Lateral: 4.9 cm
Single Plane A2C EF: 54.4 %
Single Plane A4C EF: 55 %

## 2021-06-21 ENCOUNTER — Encounter: Payer: Self-pay | Admitting: *Deleted

## 2021-08-16 NOTE — Progress Notes (Unsigned)
No chief complaint on file.  History of Present Illness: 76 yo male with history of atrial fibrillation, CAD, severe MR s/p mitral valve repair here today to re-establish cardiology care. He has not been seen in our office since 2017. He was found to have severe MR April 2012. He underwent cardiac cath 05/23/10 which showed mild non-obstructive CAD and mild LV dysfunction with EF of 50%. TEE 05/13/10 with evidence of flail anterior mitral leaflet. He has undergone mitral valve repair per Dr. Roxy Manns May 2012. He had atrial fibrillation post-op and was started on amiodarone and coumadin but both were stopped in September 2012. Echo January 2023 with LVEF=50-55%, appropriate appearance of repaired mitral valve. He was diagnosed with atrial fibrillation in primary care in December 2022 and was started on Eliquis.   He is here today for follow up. The patient denies any chest pain, dyspnea, palpitations, lower extremity edema, orthopnea, PND, dizziness, near syncope or syncope.   Primary Care Physician: Zenia Resides, MD   Past Medical History:  Diagnosis Date   Atrial fibrillation ([place])    Post-op. No recurrence.    Depression    Hypercholesteremia 04/21/2011   Mitral regurgitation    Myxomatous degeneration of mitral valve    Tobacco abuse     Past Surgical History:  Procedure Laterality Date   KNEE ARTHROSCOPY Left 06/2015   MITRAL VALVE REPAIR  06/14/2010   right minithoracotomy for complex valvuloplasty with 63m ring annuloplasty - Dr. ORoxy Manns  SHOULDER SURGERY     TONSILLECTOMY      Current Outpatient Medications  Medication Sig Dispense Refill   apixaban (ELIQUIS) 5 MG TABS tablet Take 1 tablet (5 mg total) by mouth 2 (two) times daily. 60 tablet 6   Multiple Vitamin (MULTIVITAMIN) tablet Take 1 tablet by mouth daily.       rosuvastatin (CRESTOR) 10 MG tablet Take 1 tablet (10 mg total) by mouth daily. 30 tablet 12   No current facility-administered medications for this visit.     No Known Allergies  Social History   Socioeconomic History   Marital status: Married    Spouse name: Not on file   Number of children: Not on file   Years of education: Not on file   Highest education level: Bachelor's degree (e.g., BA, AB, BS)  Occupational History   Not on file  Tobacco Use   Smoking status: Former    Packs/day: 1.00    Years: 8.00    Total pack years: 8.00    Types: Cigarettes    Quit date: 03/14/1977    Years since quitting: 44.4   Smokeless tobacco: Never  Vaping Use   Vaping Use: Never used  Substance and Sexual Activity   Alcohol use: Yes    Comment: weekly   Drug use: No   Sexual activity: Yes  Other Topics Concern   Not on file  Social History Narrative   Current Social History 12/20/2016        Patient lives with Wife in one level home 12/20/2016   Transportation: Patient has own vehicle and drives himself 187/08/6765  Important Relationships Wife, sons and brothers 12/20/2016    Pets: Lab/boxer mix (Publishing rights manager and Bichon (Leotis Pain 12/20/2016   Education / Work:  BS/ Works at [employer] standing 8-9 hours per day12/05/2016   Interests / Fun: Being active, art, music (singing), hiking (when not in pain from left foot bunion) 12/20/2016   Current Stressors: Finances 12/20/2016   Religious /  Personal Beliefs: "I believe in God." 12/20/2016      L. Ducatte, RN, BSN                                                                                                 Social Determinants of Health   Financial Resource Strain: Not on file  Food Insecurity: Not on file  Transportation Needs: Not on file  Physical Activity: Not on file  Stress: Not on file  Social Connections: Not on file  Intimate Partner Violence: Not on file    Family History  Problem Relation Age of Onset   Heart failure Mother    Dementia Mother    Cancer Father        Lung   COPD Father    Heart disease Brother    Heart disease Brother     Review of Systems:  As stated in  the HPI and otherwise negative.   There were no vitals taken for this visit.  Physical Examination: General: Well developed, well nourished, NAD  HEENT: OP clear, mucus membranes moist  SKIN: warm, dry. No rashes. Neuro: No focal deficits  Musculoskeletal: Muscle strength 5/5 all ext  Psychiatric: Mood and affect normal  Neck: No JVD, no carotid bruits, no thyromegaly, no lymphadenopathy.  Lungs:Clear bilaterally, no wheezes, rhonci, crackles Cardiovascular: Regular rate and rhythm. No murmurs, gallops or rubs. Abdomen:Soft. Bowel sounds present. Non-tender.  Extremities: No lower extremity edema. Pulses are 2 + in the bilateral DP/PT.  EKG:  EKG is *** ordered today. The ekg ordered today demonstrates   Recent Labs: 12/29/2020: ALT 16; BUN 12; Creatinine, Ser 1.06; Hemoglobin 14.9; Platelets 161; Potassium 4.3; Sodium 141; TSH 2.410    Wt Readings from Last 3 Encounters:  01/24/21 204 lb 6.4 oz (92.7 kg)  12/29/20 208 lb 12.8 oz (94.7 kg)  12/29/19 216 lb (98 kg)    Assessment and Plan:   1. S/P mitral valve repair:  Stable post MV repair. Echo in 2023 with normally functioning mitral valve repair. He will need to take antibiotics before dental procedures.    2. Paroxysmal atrial fibrillation: *** Rate controlled. Continue Eliquis.    Labs/ tests ordered today include:   No orders of the defined types were placed in this encounter.  Disposition:   F/U with me in 12  months  Signed, Lauree Chandler, MD 08/16/2021 2:25 PM    [facility] [place], [place], [place]  [address] Phone: [phone]; Fax: [phone]

## 2021-08-17 ENCOUNTER — Ambulatory Visit: Payer: PPO | Admitting: Cardiovascular Disease

## 2021-08-17 ENCOUNTER — Encounter: Payer: Self-pay | Admitting: Cardiovascular Disease

## 2021-08-17 VITALS — BP 108/60 | HR 69 | Ht 73.0 in | Wt 204.2 lb

## 2021-08-17 DIAGNOSIS — I059 Rheumatic mitral valve disease, unspecified: Secondary | ICD-10-CM

## 2021-08-17 DIAGNOSIS — I4821 Permanent atrial fibrillation: Secondary | ICD-10-CM | POA: Diagnosis not present

## 2021-08-17 NOTE — Patient Instructions (Signed)
Medication Instructions:  No changes *If you need a refill on your cardiac medications before your next appointment, please call your pharmacy*   Lab Work: none If you have labs (blood work) drawn today and your tests are completely normal, you will receive your results only by: [place] (if you have MyChart) OR A paper copy in the mail If you have any lab test that is abnormal or we need to change your treatment, we will call you to review the results.   Testing/Procedures: none   Follow-Up: At [facility], you and your health needs are our priority.  As part of our continuing mission to provide you with exceptional heart care, we have created designated Provider Care Teams.  These Care Teams include your primary Cardiologist (physician) and Advanced Practice Providers (APPs -  Physician Assistants and Nurse Practitioners) who all work together to provide you with the care you need, when you need it.   Your next appointment:   12 month(s)  The format for your next appointment:   In Person  Provider:   Lauree Chandler, MD     Important Information About Sugar

## 2021-12-01 ENCOUNTER — Other Ambulatory Visit: Payer: Self-pay | Admitting: Cardiovascular Disease

## 2021-12-01 DIAGNOSIS — R002 Palpitations: Secondary | ICD-10-CM

## 2021-12-01 DIAGNOSIS — I4821 Permanent atrial fibrillation: Secondary | ICD-10-CM

## 2021-12-01 NOTE — Telephone Encounter (Signed)
Prescription refill request for Eliquis received. Indication: Afib  Last office visit: 08/17/21 Angelena Form)  Scr: 1.06 (12/29/20)  Age: 76 Weight: 92.6kg  Appropriate dose and refill sent to requested pharmacy.

## 2022-01-07 ENCOUNTER — Other Ambulatory Visit: Payer: Self-pay | Admitting: Family Medicine

## 2022-01-07 DIAGNOSIS — E785 Hyperlipidemia, unspecified: Secondary | ICD-10-CM

## 2022-02-21 ENCOUNTER — Encounter: Payer: Self-pay | Admitting: Family Medicine

## 2022-05-13 ENCOUNTER — Other Ambulatory Visit: Payer: Self-pay | Admitting: Cardiovascular Disease

## 2022-05-13 DIAGNOSIS — I4821 Permanent atrial fibrillation: Secondary | ICD-10-CM

## 2022-05-13 DIAGNOSIS — Z9889 Other specified postprocedural states: Secondary | ICD-10-CM

## 2022-05-15 ENCOUNTER — Telehealth: Payer: Self-pay | Admitting: Pharmacist

## 2022-05-15 DIAGNOSIS — Z9889 Other specified postprocedural states: Secondary | ICD-10-CM

## 2022-05-15 NOTE — Telephone Encounter (Signed)
Called patient and LMOM that he needs labs updated for Eliquis refill

## 2022-05-15 NOTE — Telephone Encounter (Signed)
Prescription refill request for Eliquis received. Indication: a fib Last office visit: 08/17/21 Scr: overdue  Age: 77 Weight: 92kg  LMOM for patient to update labs

## 2022-05-16 ENCOUNTER — Other Ambulatory Visit: Payer: Self-pay | Admitting: *Deleted

## 2022-05-16 DIAGNOSIS — Z9889 Other specified postprocedural states: Secondary | ICD-10-CM | POA: Diagnosis not present

## 2022-05-16 NOTE — Telephone Encounter (Signed)
Pt left a message to call back. Returned call to the pt and advised we need updated labs since it's been over year. He states he does have over a week or so left of the eliquis. Advised once resulted will be able to send in refill and he verbalized understanding. Also, he will come to [facility] location and he is aware no appt needed and not to come around lunch time 1245-145p. He states he will come over today.

## 2022-05-17 LAB — CBC
Hematocrit: 40.9 % (ref 37.5–51.0)
Hemoglobin: 14.7 g/dL (ref 13.0–17.7)
MCH: 33.3 pg — ABNORMAL HIGH (ref 26.6–33.0)
MCHC: 35.9 g/dL — ABNORMAL HIGH (ref 31.5–35.7)
MCV: 93 fL (ref 79–97)
Platelets: 159 10*3/uL (ref 150–450)
RBC: 4.41 x10E6/uL (ref 4.14–5.80)
RDW: 12 % (ref 11.6–15.4)
WBC: 5.9 10*3/uL (ref 3.4–10.8)

## 2022-05-17 LAB — BASIC METABOLIC PANEL
BUN/Creatinine Ratio: 13 (ref 10–24)
BUN: 12 mg/dL (ref 8–27)
CO2: 25 mmol/L (ref 20–29)
Calcium: 9.4 mg/dL (ref 8.6–10.2)
Chloride: 101 mmol/L (ref 96–106)
Creatinine, Ser: 0.9 mg/dL (ref 0.76–1.27)
Glucose: 89 mg/dL (ref 70–99)
Potassium: 4.3 mmol/L (ref 3.5–5.2)
Sodium: 139 mmol/L (ref 134–144)
eGFR: 90 mL/min/{1.73_m2} (ref >59)

## 2022-05-17 NOTE — Telephone Encounter (Signed)
Eliquis 5mg  refill request received. Patient is 77 years old, weight-92.6kg, Crea-0.90 on 05/16/22, Diagnosis-Afib, and last seen by Dr. Clifton James on 08/17/21. Dose is appropriate based on dosing criteria. Will send in refill to requested pharmacy.

## 2022-07-23 NOTE — Progress Notes (Signed)
Subjective:   Kenneth Mclaughlin is a 77 y.o. male who presents for Medicare Annual/Subsequent preventive examination.  Visit Complete: Virtual  I connected with  Kenneth Mclaughlin on 07/24/22 by a audio enabled telemedicine application and verified that I am speaking with the correct person using two identifiers.  Patient Location: Home  Provider Location: Home Office  I discussed the limitations of evaluation and management by telemedicine. The patient expressed understanding and agreed to proceed.  Review of Systems     Cardiac Risk Factors include: advanced age (>52men, >79 women);male gender;dyslipidemia     Objective:    Today's Vitals   07/24/22 1354  Weight: 204 lb (92.5 kg)  Height: 6\' 1"  (1.854 m)   Body mass index is 26.91 kg/m.     12/29/2020    1:46 PM 12/08/2019   10:42 AM 09/10/2019    1:40 PM 08/11/2019    2:14 PM 12/20/2016   10:36 AM 12/20/2016    9:28 AM 12/14/2016    9:51 AM  Advanced Directives  Does Patient Have a Medical Advance Directive? No Yes No No No No No  Type of Furniture conservator/restorer;Living will       Copy of Healthcare Power of Attorney in Chart?  No - copy requested       Would patient like information on creating a medical advance directive? No - Patient declined  No - Patient declined No - Patient declined Yes (MAU/Ambulatory/Procedural Areas - Information given)  No - Patient declined    Current Medications (verified) Outpatient Encounter Medications as of 07/24/2022  Medication Sig   ELIQUIS 5 MG TABS tablet TAKE 1 TABLET BY MOUTH TWICE A DAY   Multiple Vitamin (MULTIVITAMIN) tablet Take 1 tablet by mouth daily.     rosuvastatin (CRESTOR) 10 MG tablet TAKE 1 TABLET BY MOUTH EVERY DAY   No facility-administered encounter medications on file as of 07/24/2022.    Allergies (verified) Patient has no known allergies.   History: Past Medical History:  Diagnosis Date   Atrial fibrillation (HCC)    Post-op. No  recurrence.    Depression    Hypercholesteremia 04/21/2011   Mitral regurgitation    Myxomatous degeneration of mitral valve    Tobacco abuse    Past Surgical History:  Procedure Laterality Date   KNEE ARTHROSCOPY Left 06/2015   MITRAL VALVE REPAIR  06/14/2010   right minithoracotomy for complex valvuloplasty with 30mm ring annuloplasty - Dr. Cornelius Moras   SHOULDER SURGERY     TONSILLECTOMY     Family History  Problem Relation Age of Onset   Heart failure Mother    Dementia Mother    Cancer Father        Lung   COPD Father    Heart disease Brother    Heart disease Brother    Social History   Socioeconomic History   Marital status: Married    Spouse name: Not on file   Number of children: Not on file   Years of education: Not on file   Highest education level: Bachelor's degree (e.g., BA, AB, BS)  Occupational History   Not on file  Tobacco Use   Smoking status: Former    Packs/day: 1.00    Years: 8.00    Additional pack years: 0.00    Total pack years: 8.00    Types: Cigarettes    Quit date: 03/14/1977    Years since quitting: 45.3   Smokeless tobacco: Never  Vaping Use   Vaping Use: Never used  Substance and Sexual Activity   Alcohol use: Yes    Comment: weekly   Drug use: No   Sexual activity: Yes  Other Topics Concern   Not on file  Social History Narrative   Current Social History 12/20/2016        Patient lives with Wife in one level home 12/20/2016   Transportation: Patient has own vehicle and drives himself 12/20/2016   Important Relationships Wife, sons and brothers 12/20/2016    Pets: Lab/boxer mix Systems developer) and Bichon Norberto Sorenson) 12/20/2016   Education / Work:  BS/ Works at [employer]; standing 8-9 hours per day12/05/2016   Interests / Fun: Being active, art, music (singing), hiking (when not in pain from left foot bunion) 12/20/2016   Current Stressors: Finances 12/20/2016   Religious / Personal Beliefs: "I believe in God." 12/20/2016      L. Leward Quan, RN, BSN        Primary caregiver for wife who is non ambulatory                                                                                                 Social Determinants of Health   Financial Resource Strain: Low Risk  (07/24/2022)   Overall Financial Resource Strain (CARDIA)    Difficulty of Paying Living Expenses: Not hard at all  Food Insecurity: No Food Insecurity (07/24/2022)   Hunger Vital Sign    Worried About Running Out of Food in the Last Year: Never true    Ran Out of Food in the Last Year: Never true  Transportation Needs: No Transportation Needs (07/24/2022)   PRAPARE - Administrator, Civil Service (Medical): No    Lack of Transportation (Non-Medical): No  Physical Activity: Sufficiently Active (07/24/2022)   Exercise Vital Sign    Days of Exercise per Week: 5 days    Minutes of Exercise per Session: 30 min  Stress: No Stress Concern Present (07/24/2022)   [employer] of Occupational Health - Occupational Stress Questionnaire    Feeling of Stress : Only a little  Social Connections: Moderately Isolated (07/24/2022)   Social Connection and Isolation Panel [NHANES]    Frequency of Communication with Friends and Family: More than three times a week    Frequency of Social Gatherings with Friends and Family: Three times a week    Attends Religious Services: Never    Active Member of Clubs or Organizations: No    Attends Engineer, structural: Never    Marital Status: Married    Tobacco Counseling Counseling given: Not Answered   Clinical Intake:  Pre-visit preparation completed: Yes  Pain : No/denies pain     Diabetes: No  How often do you need to have someone help you when you read instructions, pamphlets, or other written materials from your doctor or pharmacy?: 1 - Never  Interpreter Needed?: No  Information entered by :: Kenneth Fantasia LPN   Activities of Daily Living    07/24/2022    1:55 PM  In your present state of health, do  you  have any difficulty performing the following activities:  Hearing? 0  Vision? 0  Difficulty concentrating or making decisions? 0  Walking or climbing stairs? 0  Dressing or bathing? 0  Doing errands, shopping? 0  Preparing Food and eating ? N  Using the Toilet? N  In the past six months, have you accidently leaked urine? N  Do you have problems with loss of bowel control? N  Managing your Medications? N  Managing your Finances? N  Housekeeping or managing your Housekeeping? N    Patient Care Team: Caro Laroche, DO as PCP - General (Family Medicine) Kathleene Hazel, MD as PCP - Cardiology (Cardiology) Lenis Dickinson., MD as Referring Physician (Sports Medicine) Kathleene Hazel, MD as Consulting Physician (Cardiology)  Indicate any recent Medical Services you may have received from other than [facility] providers in the past year (date may be approximate).     Assessment:   This is a routine wellness examination for Kenneth Mclaughlin.  Hearing/Vision screen No results found.  Dietary issues and exercise activities discussed:     Goals Addressed               This Visit's Progress     COMPLETED: Contact podiatrist to take care of bunion        COMPLETED: Getting back to hiking and toning (pt-stated)        COMPLETED: Have colonoscopy (pt-stated)        Remain active and independent         Depression Screen    07/24/2022    1:57 PM 12/29/2019   11:34 AM 12/22/2019    9:56 AM 12/08/2019   11:21 AM 09/10/2019    1:40 PM 08/11/2019    2:14 PM 12/20/2016   10:37 AM  PHQ 2/9 Scores  PHQ - 2 Score 0 0 0 0 0 0 0  PHQ- 9 Score  0 0 2 1 0     Fall Risk    12/29/2020    1:50 PM 12/22/2019    9:56 AM 08/15/2018    6:02 PM 12/20/2016   10:37 AM 12/14/2016    9:51 AM  Fall Risk   Falls in the past year? 0 0 0 No No  Comment   Emmi Telephone Survey: data to providers prior to load    Number falls in past yr:  0     Follow up  Falls evaluation completed        MEDICARE RISK AT HOME:   TIMED UP AND GO:  Was the test performed?  No    Cognitive Function:        Immunizations Immunization History  Administered Date(s) Administered   Influenza, High Dose Seasonal PF 03/04/2016, 11/15/2016, 11/21/2017, 01/08/2020   Influenza,inj,Quad PF,6+ Mos 10/02/2012, 09/16/2014   Influenza-Unspecified 11/16/2020   PFIZER(Purple Top)SARS-COV-2 Vaccination 02/21/2019, 03/14/2019, 01/08/2020   Pneumococcal Conjugate-13 10/02/2012   Pneumococcal Polysaccharide-23 09/16/2014   Tdap 04/21/2011   Zoster Recombinant(Shingrix) 11/26/2016, 03/26/2017    TDAP status: Due, Education has been provided regarding the importance of this vaccine. Advised may receive this vaccine at local pharmacy or Health Dept. Aware to provide a copy of the vaccination record if obtained from local pharmacy or Health Dept. Verbalized acceptance and understanding.  Pneumococcal vaccine status: Up to date  Covid-19 vaccine status: Information provided on how to obtain vaccines.   Qualifies for Shingles Vaccine? Yes   Zostavax completed No   Shingrix Completed?: Yes  Screening Tests Health Maintenance  Topic Date Due   Colonoscopy  Never done   DTaP/Tdap/Td (2 - Td or Tdap) 04/20/2021   COVID-19 Vaccine (4 - 2023-24 season) 09/16/2021   INFLUENZA VACCINE  08/17/2022   Medicare Annual Wellness (AWV)  07/24/2023   Pneumonia Vaccine 39+ Years old  Completed   Hepatitis C Screening  Completed   Zoster Vaccines- Shingrix  Completed   HPV VACCINES  Aged Out    Health Maintenance  Health Maintenance Due  Topic Date Due   Colonoscopy  Never done   DTaP/Tdap/Td (2 - Td or Tdap) 04/20/2021   COVID-19 Vaccine (4 - 2023-24 season) 09/16/2021    Colorectal cancer screening:  Cologuard ordered today  Lung Cancer Screening: (Low Dose CT Chest recommended if Age 4-80 years, 20 pack-year currently smoking OR have quit w/in 15years.) does not qualify.   Lung Cancer  Screening Referral: n/a  Additional Screening:  Hepatitis C Screening: does qualify; Completed 09/16/14  Vision Screening: Recommended annual ophthalmology exams for early detection of glaucoma and other disorders of the eye. Is the patient up to date with their annual eye exam?  No  Who is the provider or what is the name of the office in which the patient attends annual eye exams? none If pt is not established with a provider, would they like to be referred to a provider to establish care? No .   Dental Screening: Recommended annual dental exams for proper oral hygiene  Community Resource Referral / Chronic Care Management: CRR required this visit?  No   CCM required this visit?  No     Plan:     I have personally reviewed and noted the following in the patient's chart:   Medical and social history Use of alcohol, tobacco or illicit drugs  Current medications and supplements including opioid prescriptions. Patient is not currently taking opioid prescriptions. Functional ability and status Nutritional status Physical activity Advanced directives List of other physicians Hospitalizations, surgeries, and ER visits in previous 12 months Vitals Screenings to include cognitive, depression, and falls Referrals and appointments  In addition, I have reviewed and discussed with patient certain preventive protocols, quality metrics, and best practice recommendations. A written personalized care plan for preventive services as well as general preventive health recommendations were provided to patient.     Kenneth Mclaughlin, [place]   01/21/[birth year]94   After Visit Summary: (Mail) Due to this being a telephonic visit, the after visit summary with patients personalized plan was offered to patient via mail   Nurse Notes: Patient scheduled for office visit 08/15/22

## 2022-07-23 NOTE — Patient Instructions (Incomplete)
Kenneth Mclaughlin , Thank you for taking time to come for your Medicare Wellness Visit. I appreciate your ongoing commitment to your health goals. Please review the following plan we discussed and let me know if I can assist you in the future.   These are the goals we discussed:  Goals      Remain active and independent        This is a list of the screening recommended for you and due dates:  Health Maintenance  Topic Date Due   Colon Cancer Screening  Never done   DTaP/Tdap/Td vaccine (2 - Td or Tdap) 04/20/2021   COVID-19 Vaccine (4 - 2023-24 season) 09/16/2021   Flu Shot  08/17/2022   Medicare Annual Wellness Visit  07/24/2023   Pneumonia Vaccine  Completed   Hepatitis C Screening  Completed   Zoster (Shingles) Vaccine  Completed   HPV Vaccine  Aged Out    Advanced directives: Information on Advanced Care Planning can be found at [facility] Advance Health Care Directives Advance Health Care Directives ([email or web address] Please bring a copy of your health care power of attorney and living will to the office to be added to your chart at your convenience.  Conditions/risks identified: Aim for 30 minutes of exercise or brisk walking, 6-8 glasses of water, and 5 servings of fruits and vegetables each day.  Next appointment: Follow up in one year for your annual wellness visit.   Preventive Care 75 Years and Older, Male  Preventive care refers to lifestyle choices and visits with your health care provider that can promote health and wellness. What does preventive care include? A yearly physical exam. This is also called an annual well check. Dental exams once or twice a year. Routine eye exams. Ask your health care provider how often you should have your eyes checked. Personal lifestyle choices, including: Daily care of your teeth and gums. Regular physical activity. Eating a healthy diet. Avoiding tobacco and drug use. Limiting alcohol use. Practicing safe  sex. Taking low doses of aspirin every day. Taking vitamin and mineral supplements as recommended by your health care provider. What happens during an annual well check? The services and screenings done by your health care provider during your annual well check will depend on your age, overall health, lifestyle risk factors, and family history of disease. Counseling  Your health care provider may ask you questions about your: Alcohol use. Tobacco use. Drug use. Emotional well-being. Home and relationship well-being. Sexual activity. Eating habits. History of falls. Memory and ability to understand (cognition). Work and work Astronomer. Screening  You may have the following tests or measurements: Height, weight, and BMI. Blood pressure. Lipid and cholesterol levels. These may be checked every 5 years, or more frequently if you are over 61 years old. Skin check. Lung cancer screening. You may have this screening every year starting at age 75 if you have a 30-pack-year history of smoking and currently smoke or have quit within the past 15 years. Fecal occult blood test (FOBT) of the stool. You may have this test every year starting at age 18. Flexible sigmoidoscopy or colonoscopy. You may have a sigmoidoscopy every 5 years or a colonoscopy every 10 years starting at age 26. Prostate cancer screening. Recommendations will vary depending on your family history and other risks. Hepatitis C blood test. Hepatitis B blood test. Sexually transmitted disease (STD) testing. Diabetes screening. This is done by checking your blood sugar (glucose) after you have not  eaten for a while (fasting). You may have this done every 1-3 years. Abdominal aortic aneurysm (AAA) screening. You may need this if you are a current or former smoker. Osteoporosis. You may be screened starting at age 42 if you are at high risk. Talk with your health care provider about your test results, treatment options, and if  necessary, the need for more tests. Vaccines  Your health care provider may recommend certain vaccines, such as: Influenza vaccine. This is recommended every year. Tetanus, diphtheria, and acellular pertussis (Tdap, Td) vaccine. You may need a Td booster every 10 years. Zoster vaccine. You may need this after age 21. Pneumococcal 13-valent conjugate (PCV13) vaccine. One dose is recommended after age 75. Pneumococcal polysaccharide (PPSV23) vaccine. One dose is recommended after age 34. Talk to your health care provider about which screenings and vaccines you need and how often you need them. This information is not intended to replace advice given to you by your health care provider. Make sure you discuss any questions you have with your health care provider. Document Released: 01/29/2015 Document Revised: 09/22/2015 Document Reviewed: 11/03/2014 Elsevier Interactive Patient Education  2017 [employer].  Fall Prevention in the Home Falls can cause injuries. They can happen to people of all ages. There are many things you can do to make your home safe and to help prevent falls. What can I do on the outside of my home? Regularly fix the edges of walkways and driveways and fix any cracks. Remove anything that might make you trip as you walk through a door, such as a raised step or threshold. Trim any bushes or trees on the path to your home. Use bright outdoor lighting. Clear any walking paths of anything that might make someone trip, such as rocks or tools. Regularly check to see if handrails are loose or broken. Make sure that both sides of any steps have handrails. Any raised decks and porches should have guardrails on the edges. Have any leaves, snow, or ice cleared regularly. Use sand or salt on walking paths during winter. Clean up any spills in your garage right away. This includes oil or grease spills. What can I do in the bathroom? Use night lights. Install grab bars by the toilet  and in the tub and shower. Do not use towel bars as grab bars. Use non-skid mats or decals in the tub or shower. If you need to sit down in the shower, use a plastic, non-slip stool. Keep the floor dry. Clean up any water that spills on the floor as soon as it happens. Remove soap buildup in the tub or shower regularly. Attach bath mats securely with double-sided non-slip rug tape. Do not have throw rugs and other things on the floor that can make you trip. What can I do in the bedroom? Use night lights. Make sure that you have a light by your bed that is easy to reach. Do not use any sheets or blankets that are too big for your bed. They should not hang down onto the floor. Have a firm chair that has side arms. You can use this for support while you get dressed. Do not have throw rugs and other things on the floor that can make you trip. What can I do in the kitchen? Clean up any spills right away. Avoid walking on wet floors. Keep items that you use a lot in easy-to-reach places. If you need to reach something above you, use a strong step stool that  has a grab bar. Keep electrical cords out of the way. Do not use floor polish or wax that makes floors slippery. If you must use wax, use non-skid floor wax. Do not have throw rugs and other things on the floor that can make you trip. What can I do with my stairs? Do not leave any items on the stairs. Make sure that there are handrails on both sides of the stairs and use them. Fix handrails that are broken or loose. Make sure that handrails are as long as the stairways. Check any carpeting to make sure that it is firmly attached to the stairs. Fix any carpet that is loose or worn. Avoid having throw rugs at the top or bottom of the stairs. If you do have throw rugs, attach them to the floor with carpet tape. Make sure that you have a light switch at the top of the stairs and the bottom of the stairs. If you do not have them, ask someone to add  them for you. What else can I do to help prevent falls? Wear shoes that: Do not have high heels. Have rubber bottoms. Are comfortable and fit you well. Are closed at the toe. Do not wear sandals. If you use a stepladder: Make sure that it is fully opened. Do not climb a closed stepladder. Make sure that both sides of the stepladder are locked into place. Ask someone to hold it for you, if possible. Clearly mark and make sure that you can see: Any grab bars or handrails. First and last steps. Where the edge of each step is. Use tools that help you move around (mobility aids) if they are needed. These include: Canes. Walkers. Scooters. Crutches. Turn on the lights when you go into a dark area. Replace any light bulbs as soon as they burn out. Set up your furniture so you have a clear path. Avoid moving your furniture around. If any of your floors are uneven, fix them. If there are any pets around you, be aware of where they are. Review your medicines with your doctor. Some medicines can make you feel dizzy. This can increase your chance of falling. Ask your doctor what other things that you can do to help prevent falls. This information is not intended to replace advice given to you by your health care provider. Make sure you discuss any questions you have with your health care provider. Document Released: 10/29/2008 Document Revised: 06/10/2015 Document Reviewed: 02/06/2014 Elsevier Interactive Patient Education  2017 [employer].

## 2022-07-24 ENCOUNTER — Ambulatory Visit (INDEPENDENT_AMBULATORY_CARE_PROVIDER_SITE_OTHER): Payer: PPO

## 2022-07-24 VITALS — Ht 73.0 in | Wt 204.0 lb

## 2022-07-24 DIAGNOSIS — Z1211 Encounter for screening for malignant neoplasm of colon: Secondary | ICD-10-CM

## 2022-07-24 DIAGNOSIS — Z0000 Encounter for general adult medical examination without abnormal findings: Secondary | ICD-10-CM

## 2022-07-24 DIAGNOSIS — R195 Other fecal abnormalities: Secondary | ICD-10-CM

## 2022-08-08 DIAGNOSIS — Z1211 Encounter for screening for malignant neoplasm of colon: Secondary | ICD-10-CM | POA: Diagnosis not present

## 2022-08-14 ENCOUNTER — Encounter: Payer: Self-pay | Admitting: Family Medicine

## 2022-08-14 DIAGNOSIS — R195 Other fecal abnormalities: Secondary | ICD-10-CM | POA: Insufficient documentation

## 2022-08-14 NOTE — Addendum Note (Signed)
Addended by: Caro Laroche on: 08/14/2022 08:58 AM   Modules accepted: Orders

## 2022-08-15 ENCOUNTER — Ambulatory Visit: Payer: PPO | Admitting: Family Medicine

## 2022-08-21 ENCOUNTER — Ambulatory Visit (INDEPENDENT_AMBULATORY_CARE_PROVIDER_SITE_OTHER): Payer: PPO | Admitting: Family Medicine

## 2022-08-21 ENCOUNTER — Encounter: Payer: Self-pay | Admitting: Family Medicine

## 2022-08-21 VITALS — BP 118/88 | HR 82 | Wt 203.4 lb

## 2022-08-21 DIAGNOSIS — I480 Paroxysmal atrial fibrillation: Secondary | ICD-10-CM

## 2022-08-21 DIAGNOSIS — E785 Hyperlipidemia, unspecified: Secondary | ICD-10-CM

## 2022-08-21 DIAGNOSIS — Z0000 Encounter for general adult medical examination without abnormal findings: Secondary | ICD-10-CM

## 2022-08-21 DIAGNOSIS — Z9889 Other specified postprocedural states: Secondary | ICD-10-CM

## 2022-08-21 DIAGNOSIS — R195 Other fecal abnormalities: Secondary | ICD-10-CM | POA: Diagnosis not present

## 2022-08-21 NOTE — Assessment & Plan Note (Signed)
Tolerant of statin, continue. Recheck lipid panel.

## 2022-08-21 NOTE — Progress Notes (Signed)
   SUBJECTIVE:   CHIEF COMPLAINT / HPI:   MV repair, PAF: - h/o severe MP s/p repair 2012. H/o post op afib on coumadin, amiodarone.  - follows with Cardiology, due for f/u - Medications: eliquis - Compliance: good - Denies any SOB, CP, medication SEs  Positive cologuard - 07/2022. Occasional small amount of blood on toilet paper when wipes. Previously placed GI referral, hasn't heard about appointment yet.  Health maintenance - due for Tdap  OBJECTIVE:   BP 118/88 (BP Location: Left Arm, Patient Position: Sitting, Cuff Size: Normal)   Pulse 82   Wt 203 lb 6.4 oz (92.3 kg)   SpO2 95%   BMI 26.84 kg/m   Gen: well appearing, in NAD Card: RRR Lungs: CTAB Ext: WWP, no edema  ASSESSMENT/PLAN:   PAF (paroxysmal atrial fibrillation) (HCC) Appears in regular rhythm today with occasional PVCs. Continue eliquis. Make appointment soon for Cardiology f/u.  Positive colorectal cancer screening using Cologuard test GI referral for colonoscopy previously placed.  Hyperlipidemia Tolerant of statin, continue. Recheck lipid panel.   Health Maintenance - receive Tdap from pharmacy.   Caro Laroche, DO

## 2022-08-21 NOTE — Assessment & Plan Note (Signed)
GI referral for colonoscopy previously placed.

## 2022-08-21 NOTE — Patient Instructions (Addendum)
It was great to see you!  Our plans for today:  - Make an appointment soon to follow up with Cardiology.  - Let us know if you don't here from Gastroenterology to schedule your colonoscopy. - You can get your updated tetanus vaccine from your pharmacy.   We are checking some labs today, we will release these results to your MyChart.  Take care and seek immediate care sooner if you develop any concerns.   Dr. Linwood Dibbles

## 2022-08-21 NOTE — Assessment & Plan Note (Signed)
Appears in regular rhythm today with occasional PVCs. Continue eliquis. Make appointment soon for Cardiology f/u.

## 2022-08-22 ENCOUNTER — Encounter: Payer: Self-pay | Admitting: Family Medicine

## 2022-11-28 ENCOUNTER — Other Ambulatory Visit: Payer: Self-pay | Admitting: Cardiovascular Disease

## 2022-11-28 ENCOUNTER — Encounter: Payer: Self-pay | Admitting: Gastroenterology

## 2022-11-28 ENCOUNTER — Telehealth: Payer: Self-pay | Admitting: Cardiovascular Disease

## 2022-11-28 DIAGNOSIS — I4821 Permanent atrial fibrillation: Secondary | ICD-10-CM

## 2022-11-28 DIAGNOSIS — Z9889 Other specified postprocedural states: Secondary | ICD-10-CM

## 2022-11-28 NOTE — Telephone Encounter (Signed)
*  STAT* If patient is at the pharmacy, call can be transferred to refill team.   1. Which medications need to be refilled? (please list name of each medication and dose if known)  ELIQUIS 5 MG TABS tablet  2. Which pharmacy/location (including street and city if local pharmacy) is medication to be sent to? [id number] - [place], [place] - [address]  3. Do they need a 30 day or 90 day supply?   90 day supply

## 2022-11-28 NOTE — Telephone Encounter (Signed)
Prescription refill request for Eliquis received. Indication: Afib  Last office visit: 08/17/21 Aundra Dubin)  Scr: 0.90 (05/16/22)  Age: 77 Weight: 92.3kg  Office visit overdue. Called pt, no answer. Left message on voicemail. Message sent to schedulers.

## 2022-12-12 ENCOUNTER — Ambulatory Visit: Payer: PPO | Admitting: Cardiology

## 2022-12-16 NOTE — Progress Notes (Unsigned)
Cardiology Office Note    Date:  12/16/2022  ID:  Kenneth Mclaughlin, Kenneth Mclaughlin [birth year], MRN [id number] PCP:  Caro Laroche, DO  Cardiologist:  Verne Carrow, MD  Electrophysiologist:  None   Chief Complaint: ***  History of Present Illness: .    Kenneth Mclaughlin is a 77 y.o. male with visit-pertinent history of severe MR s/p mitral valve repair in 2012, atrial fibrillation, CAD.  In April 2012 he was found to have severe MR.  He underwent cardiac cath on 05/23/2010 which showed mild nonobstructive CAD and mild LV dysfunction with a EF of 50%.  On 05/13/2010 he underwent TEE with evidence of flail anterior mitral leaflet.  In May 2012 he underwent mitral valve repair with Dr. Cornelius Moras.  Postop he had atrial fibrillation and was started on amiodarone and Coumadin, both were stopped in September 2012.  In 12/2020 he was diagnosed with atrial fibrillation by his PCP and was started on Eliquis.  Echocardiogram in 01/2021 indicated LVEF of 50 to 55%, appropriate appearance of repaired mitral valve.  He was last seen in clinic by Dr. Clifton James on 08/17/21, he had remained stable from a cardiac perspective.   Severe MR s/p Mitral valve repair in 2012: Echo in 2023 with normally functioning mitral valve repair. Today he denies  Discussed the use of prophylactic antibiotics before dental work and other surgeries. Prescription given for {sbe meds:[id number]}.   Paroxysmal atrial fibrillation:  EKG today indicates  Continue Eliquis  Check CBC and BMET   Nonobstructive CAD: Cardiac cath in 2012 indicated mild nonobstructive CAD. Stable with no anginal symptoms. No indication for ischemic evaluation.   Continue statin, followed by PCP.   Hyperlipidemia: Last lipid profile on 08/21/2022 indicated total cholesterol 121, HDL 38, triglycerides 109 and LDL 63.  Monitored and managed per PCP.  On rosuvastatin 10 mg daily.  ROS: .    *** denies chest pain, shortness of breath, lower extremity edema, fatigue,  palpitations, melena, hematuria, hemoptysis, diaphoresis, weakness, presyncope, syncope, orthopnea, and PND. .  All other systems are reviewed and otherwise negative.  Studies Reviewed: Marland Kitchen    EKG:  EKG is ordered today, personally reviewed, demonstrating ***      CV Studies:  Cardiac Studies & Procedures       ECHOCARDIOGRAM  ECHOCARDIOGRAM COMPLETE 02/10/2021  Narrative ECHOCARDIOGRAM REPORT    Patient Name:   Kenneth Mclaughlin Date of Exam: 02/10/2021 Medical Rec #:  [id number]      Height:       73.0 in Accession #:    [id number]     Weight:       204.4 lb Date of Birth:  [birth year]      BSA:          2.172 m Patient Age:    75 years       BP:           128/65 mmHg Patient Gender: M              HR:           61 bpm. Exam Location:  [address]  Procedure: 2D Echo, Cardiac Doppler and Color Doppler  Indications:    I 05.9 MV REPAIR  History:        Patient has prior history of Echocardiogram examinations, most recent 03/11/2015. CAD; Risk Factors:Dyslipidemia. SBE /MV REPAIR /.  Mitral Valve: prosthetic annuloplasty ring valve is present in the mitral position.  Sonographer:    Festus Barren  Referring Phys: Kathleene Hazel  IMPRESSIONS   1. Left ventricular ejection fraction, by estimation, is 50 to 55%. The left ventricle has low normal function. The left ventricle has no regional wall motion abnormalities. The left ventricular internal cavity size was mildly dilated. Diastolic function indeterminant due to MVR. 2. Right ventricular systolic function is normal. The right ventricular size is mildly enlarged. There is mildly elevated pulmonary artery systolic pressure. The estimated right ventricular systolic pressure is 42.1 mmHg. 3. Left atrial size was severely dilated. 4. Right atrial size was severely dilated. 5. The mitral valve has been repaired/replaced. There is a prosthetic annuloplasty ring present in the mitral position. Mean gradient at HR  60bpm. MVA by VTI 1.9cm2. There is trivial mitral regurgitation. 6. The aortic valve is tricuspid. There is mild thickening of the aortic valve. Aortic valve regurgitation is trivial. Aortic valve sclerosis is present, with no evidence of aortic valve stenosis. 7. Frequent PVCs throughout study. 8. The inferior vena cava is normal in size with <50% respiratory variability, suggesting right atrial pressure of 8 mmHg.  Comparison(s): Compared to prior TTE report in 2017, the EF appears slightly lower at 50-55% (previously 55-60%). Previous MV mean gradient .  FINDINGS Left Ventricle: Left ventricular ejection fraction, by estimation, is 50 to 55%. The left ventricle has low normal function. The left ventricle has no regional wall motion abnormalities. The left ventricular internal cavity size was mildly dilated. There is no left ventricular hypertrophy. Abnormal (paradoxical) septal motion consistent with post-operative status. Diastolic function indeterminant due to MVR.  Right Ventricle: The right ventricular size is mildly enlarged. No increase in right ventricular wall thickness. Right ventricular systolic function is normal. There is mildly elevated pulmonary artery systolic pressure. The tricuspid regurgitant velocity is 2.92 m/s, and with an assumed right atrial pressure of 8 mmHg, the estimated right ventricular systolic pressure is 42.1 mmHg.  Left Atrium: Left atrial size was severely dilated.  Right Atrium: Right atrial size was severely dilated.  Pericardium: There is no evidence of pericardial effusion.  Mitral Valve: The mitral valve has been repaired/replaced. Trivial mitral valve regurgitation. There is a prosthetic annuloplasty ring present in the mitral position. Mean gradient at HR 60bpm.  Tricuspid Valve: The tricuspid valve is normal in structure. Tricuspid valve regurgitation is mild.  Aortic Valve: The aortic valve is tricuspid. There is mild thickening of the  aortic valve. Aortic valve regurgitation is trivial. Aortic regurgitation PHT measures 244 msec. Aortic valve sclerosis is present, with no evidence of aortic valve stenosis. Aortic valve mean gradient measures 5.0 mmHg. Aortic valve peak gradient measures 8.9 mmHg. Aortic valve area, by VTI measures 3.88 cm.  Pulmonic Valve: The pulmonic valve was normal in structure. Pulmonic valve regurgitation is trivial.  Aorta: The aortic root and ascending aorta are structurally normal, with no evidence of dilitation.  Venous: The inferior vena cava is normal in size with less than 50% respiratory variability, suggesting right atrial pressure of 8 mmHg.  IAS/Shunts: The atrial septum is grossly normal.   LEFT VENTRICLE PLAX 2D LVIDd:         5.80 cm LVIDs:         4.90 cm LV PW:         1.20 cm LV IVS:        1.00 cm LVOT diam:     2.70 cm LV SV:         121 LV SV Index:   56 LVOT Area:  5.73 cm  LV Volumes (MOD) LV vol d, MOD A2C: 144.0 ml LV vol d, MOD A4C: 135.0 ml LV vol s, MOD A2C: 65.7 ml LV vol s, MOD A4C: 60.8 ml LV SV MOD A2C:     78.3 ml LV SV MOD A4C:     135.0 ml LV SV MOD BP:      78.7 ml  RIGHT VENTRICLE             IVC RV Basal diam:  5.40 cm     IVC diam: 1.40 cm RV Mid diam:    4.10 cm RV S prime:     12.90 cm/s RVOT diam:      2.70 cm TAPSE (M-mode): 1.3 cm  LEFT ATRIUM              Index        RIGHT ATRIUM           Index LA diam:        4.30 cm  1.98 cm/m   RA Area:     25.00 cm LA Vol (A2C):   120.0 ml 55.26 ml/m  RA Volume:   89.30 ml  41.12 ml/m LA Vol (A4C):   129.0 ml 59.40 ml/m LA Biplane Vol: 126.0 ml 58.02 ml/m AORTIC VALVE                     PULMONIC VALVE AV Area (Vmax):    3.88 cm      PV Vmax:       0.65 m/s AV Area (Vmean):   3.53 cm      PV Vmean:      42.200 cm/s AV Area (VTI):     3.88 cm      PV VTI:        0.139 m AV Vmax:           149.00 cm/s   PV Peak grad:  1.7 mmHg AV Vmean:          103.000 cm/s  PV Mean grad:  1.0  mmHg AV VTI:            0.313 m AV Peak Grad:      8.9 mmHg AV Mean Grad:      5.0 mmHg LVOT Vmax:         101.00 cm/s LVOT Vmean:        63.500 cm/s LVOT VTI:          0.212 m LVOT/AV VTI ratio: 0.68 AI PHT:            244 msec  AORTA Ao Root diam: 3.50 cm Ao Asc diam:  2.90 cm  MITRAL VALVE                 TRICUSPID VALVE MV Area VTI:  1.12 cm       TR Peak grad:   34.1 mmHg MV Peak grad: 52.4 mmHg      TR Vmax:        292.00 cm/s MV Mean grad: 38.0 mmHg MV Vmax:      3.62 m/s       SHUNTS MV Vmean:     297.0 cm/s     Systemic VTI:  0.21 m MR Peak grad:   58.7 mmHg    Systemic Diam: 2.70 cm MR Mean grad:   39.0 mmHg    Pulmonic Diam: 2.70 cm MR Vmax:        383.00 cm/s MR Vmean:  293.0 cm/s MR PISA:        4.02 cm MR PISA Radius: 0.80 cm  Laurance Flatten MD Electronically signed by Laurance Flatten MD Signature Date/Time: 02/10/2021/3:23:55 PM    Final               Current Reported Medications:.    No outpatient medications have been marked as taking for the 12/19/22 encounter (Appointment) with Rip Harbour, NP.    Physical Exam:    VS:  There were no vitals taken for this visit.   Wt Readings from Last 3 Encounters:  08/21/22 203 lb 6.4 oz (92.3 kg)  07/24/22 204 lb (92.5 kg)  08/17/21 204 lb 3.2 oz (92.6 kg)    GEN: Well nourished, well developed in no acute distress NECK: No JVD; No carotid bruits CARDIAC: ***RRR, no murmurs, rubs, gallops RESPIRATORY:  Clear to auscultation without rales, wheezing or rhonchi  ABDOMEN: Soft, non-tender, non-distended EXTREMITIES:  No edema; No acute deformity   Asessement and Plan:.     ***     Disposition: F/u with ***  Signed, Rip Harbour, NP

## 2022-12-19 ENCOUNTER — Ambulatory Visit: Payer: PPO | Admitting: Cardiology

## 2022-12-19 DIAGNOSIS — I059 Rheumatic mitral valve disease, unspecified: Secondary | ICD-10-CM

## 2022-12-19 DIAGNOSIS — Z9889 Other specified postprocedural states: Secondary | ICD-10-CM

## 2022-12-19 DIAGNOSIS — I4821 Permanent atrial fibrillation: Secondary | ICD-10-CM

## 2022-12-27 ENCOUNTER — Telehealth: Payer: Self-pay

## 2022-12-27 ENCOUNTER — Ambulatory Visit: Payer: PPO | Admitting: Gastroenterology

## 2022-12-27 ENCOUNTER — Encounter: Payer: Self-pay | Admitting: Gastroenterology

## 2022-12-27 VITALS — BP 124/70 | HR 78 | Ht 73.0 in | Wt 205.0 lb

## 2022-12-27 DIAGNOSIS — Z860100 Personal history of colon polyps, unspecified: Secondary | ICD-10-CM

## 2022-12-27 DIAGNOSIS — Z7901 Long term (current) use of anticoagulants: Secondary | ICD-10-CM | POA: Diagnosis not present

## 2022-12-27 DIAGNOSIS — K625 Hemorrhage of anus and rectum: Secondary | ICD-10-CM

## 2022-12-27 DIAGNOSIS — I4891 Unspecified atrial fibrillation: Secondary | ICD-10-CM

## 2022-12-27 DIAGNOSIS — R195 Other fecal abnormalities: Secondary | ICD-10-CM

## 2022-12-27 MED ORDER — NA SULFATE-K SULFATE-MG SULF 17.5-3.13-1.6 GM/177ML PO SOLN: 1.0000 | Freq: Once | ORAL | 0 refills | Status: AC

## 2022-12-27 NOTE — Telephone Encounter (Signed)
[facility] Pre-operative Risk Assessment     Request for surgical clearance:     Endoscopy Procedure  What type of surgery is being performed?     Colonoscopy  When is this surgery scheduled?     03/08/23  What type of clearance is required ?   Pharmacy  Are there any medications that need to be held prior to surgery and how long? Eliquis & 2 days  Practice name and name of physician performing surgery?      [facility]  What is your office phone and fax number?      Phone- [phone]  Fax- [phone]  Anesthesia type (None, local, MAC, general) ?       MAC   Please route your response to Circe Chilton, CMA

## 2022-12-27 NOTE — Patient Instructions (Addendum)
You have been scheduled for a colonoscopy. Please follow written instructions given to you at your visit today.  Please pick up your prep supplies at the pharmacy within the next 1-3 days. If you use inhalers (even only as needed), please bring them with you on the day of your procedure.   You will be contacted by our office prior to your procedure for directions on holding your Eliquis.  If you do not hear from our office 1 week prior to your scheduled procedure, please call [phone] to discuss.    Due to recent changes in healthcare laws, you may see the results of your imaging and laboratory studies on MyChart before your provider has had a chance to review them.  We understand that in some cases there may be results that are confusing or concerning to you. Not all laboratory results come back in the same time frame and the provider may be waiting for multiple results in order to interpret others.  Please give [place] 48 hours in order for your provider to thoroughly review all the results before contacting the office for clarification of your results.    It was a pleasure to see you today!  Thank you for trusting me with your gastrointestinal care!

## 2022-12-27 NOTE — Telephone Encounter (Signed)
 Pharmacy please advise on holding Eliquis prior to colonoscopy scheduled for 03/08/2023. Thank you.

## 2022-12-27 NOTE — Progress Notes (Signed)
Chief Complaint: Positive Cologuard Primary GI MD: Gentry Fitz  HPI: 77 year old male history of severe mitral valve prolapse s/p repair 2012, CAD (2012), A-fib on Eliquis, hypertension, presents for evaluation of positive Cologuard.  Positive Cologuard July 2024 through PCP.  He did report occasional small amount of blood on tissue paper with wiping at that time.  Echocardiogram January 2023 with LVEF 50 to 55%  No previous colonoscopy on file  Patient states he had his colonoscopy done at age 62 [facility] in [place].  He states he was told he had a few small polyps but no one ever told him when to come back so he has not had another colonoscopy.  He denies GI issues today  He takes care of his wife who is unable to ambulate on her own so he typically uses a [employer] lift to get her around the house.  She also has dementia.  His free time is limited as a result but he does have someone who comes Tuesdays and Thursdays to help him out.  He is very active.   He is scheduled to see his cardiologist 12/19  Past Medical History:  Diagnosis Date   Atrial fibrillation (HCC)    Post-op. No recurrence.    Depression    Hypercholesteremia 04/21/2011   Irregularly irregular cardiac rhythm 12/29/2020   Mitral regurgitation    Myxomatous degeneration of mitral valve    Tobacco abuse     Past Surgical History:  Procedure Laterality Date   KNEE ARTHROSCOPY Left 06/2015   MITRAL VALVE REPAIR  06/14/2010   right minithoracotomy for complex valvuloplasty with 30mm ring annuloplasty - Dr. Cornelius Moras   SHOULDER SURGERY     TONSILLECTOMY      Current Outpatient Medications  Medication Sig Dispense Refill   apixaban (ELIQUIS) 5 MG TABS tablet TAKE 1 TABLET BY MOUTH TWICE A DAY 180 tablet 0   Multiple Vitamin (MULTIVITAMIN) tablet Take 1 tablet by mouth daily.       rosuvastatin (CRESTOR) 10 MG tablet TAKE 1 TABLET BY MOUTH EVERY DAY 90 tablet 3   No current facility-administered medications for  this visit.    Allergies as of 12/27/2022   (No Known Allergies)    Family History  Problem Relation Age of Onset   Heart failure Mother    Dementia Mother    Cancer Father        Lung   COPD Father    Heart disease Brother    Heart disease Brother     Social History   Socioeconomic History   Marital status: Married    Spouse name: Not on file   Number of children: Not on file   Years of education: Not on file   Highest education level: Bachelor's degree (e.g., BA, AB, BS)  Occupational History   Not on file  Tobacco Use   Smoking status: Former    Current packs/day: 0.00    Average packs/day: 1 pack/day for 8.0 years (8.0 ttl pk-yrs)    Types: Cigarettes    Start date: 03/14/1969    Quit date: 03/14/1977    Years since quitting: 45.8   Smokeless tobacco: Never  Vaping Use   Vaping status: Never Used  Substance and Sexual Activity   Alcohol use: Yes    Comment: weekly   Drug use: No   Sexual activity: Yes  Other Topics Concern   Not on file  Social History Narrative   Current Social History 12/20/2016  Patient lives with Wife in one level home 12/20/2016   Transportation: Patient has own vehicle and drives himself 12/20/2016   Important Relationships Wife, sons and brothers 12/20/2016    Pets: Lab/boxer mix Systems developer) and Bichon Norberto Sorenson) 12/20/2016   Education / Work:  BS/ Works at [employer]; standing 8-9 hours per day12/05/2016   Interests / Fun: Being active, art, music (singing), hiking (when not in pain from left foot bunion) 12/20/2016   Current Stressors: Finances 12/20/2016   Religious / Personal Beliefs: "I believe in God." 12/20/2016      L. Leward Quan, RN, BSN       Primary caregiver for wife who is non ambulatory                                                                                                 Social Determinants of Health   Financial Resource Strain: Low Risk  (07/24/2022)   Overall Financial Resource Strain (CARDIA)    Difficulty of  Paying Living Expenses: Not hard at all  Food Insecurity: No Food Insecurity (07/24/2022)   Hunger Vital Sign    Worried About Running Out of Food in the Last Year: Never true    Ran Out of Food in the Last Year: Never true  Transportation Needs: No Transportation Needs (07/24/2022)   PRAPARE - Administrator, Civil Service (Medical): No    Lack of Transportation (Non-Medical): No  Physical Activity: Sufficiently Active (07/24/2022)   Exercise Vital Sign    Days of Exercise per Week: 5 days    Minutes of Exercise per Session: 30 min  Stress: No Stress Concern Present (07/24/2022)   [employer] of Occupational Health - Occupational Stress Questionnaire    Feeling of Stress : Only a little  Social Connections: Moderately Isolated (07/24/2022)   Social Connection and Isolation Panel [NHANES]    Frequency of Communication with Friends and Family: More than three times a week    Frequency of Social Gatherings with Friends and Family: Three times a week    Attends Religious Services: Never    Active Member of Clubs or Organizations: No    Attends Banker Meetings: Never    Marital Status: Married  Catering manager Violence: Not At Risk (07/24/2022)   Humiliation, Afraid, Rape, and Kick questionnaire    Fear of Current or Ex-Partner: No    Emotionally Abused: No    Physically Abused: No    Sexually Abused: No    Review of Systems:    Constitutional: No weight loss, fever, chills, weakness or fatigue HEENT: Eyes: No change in vision               Ears, Nose, Throat:  No change in hearing or congestion Skin: No rash or itching Cardiovascular: No chest pain, chest pressure or palpitations   Respiratory: No SOB or cough Gastrointestinal: See HPI and otherwise negative Genitourinary: No dysuria or change in urinary frequency Neurological: No headache, dizziness or syncope Musculoskeletal: No new muscle or joint pain Hematologic: No bleeding or bruising Psychiatric:  No history of depression or anxiety  Physical Exam:  Vital signs: There were no vitals taken for this visit.  Constitutional: NAD, Well developed, Well nourished, alert and cooperative.  Appears significantly younger than stated age Head:  Normocephalic and atraumatic. Eyes:   PEERL, EOMI. No icterus. Conjunctiva pink. Respiratory: Respirations even and unlabored. Lungs clear to auscultation bilaterally.   No wheezes, crackles, or rhonchi.  Cardiovascular:  Regular rate and rhythm. No peripheral edema, cyanosis or pallor.  Rectal:  Not performed.  Msk:  Symmetrical without gross deformities. Without edema, no deformity or joint abnormality.  Neurologic:  Alert and  oriented x4;  grossly normal neurologically.  Skin:   Dry and intact without significant lesions or rashes. Psychiatric: Oriented to person, place and time. Demonstrates good judgement and reason without abnormal affect or behaviors.   RELEVANT LABS AND IMAGING: CBC    Component Value Date/Time   WBC 5.9 05/16/2022 1422   WBC 7.2 06/18/2010 0600   RBC 4.41 05/16/2022 1422   RBC 3.27 (L) 06/18/2010 0600   HGB 14.7 05/16/2022 1422   HCT 40.9 05/16/2022 1422   PLT 159 05/16/2022 1422   MCV 93 05/16/2022 1422   MCH 33.3 (H) 05/16/2022 1422   MCH 31.8 06/18/2010 0600   MCHC 35.9 (H) 05/16/2022 1422   MCHC 34.1 06/18/2010 0600   RDW 12.0 05/16/2022 1422   LYMPHSABS 1.8 05/19/2010 1024   MONOABS 0.7 05/19/2010 1024   EOSABS 0.1 05/19/2010 1024   BASOSABS 0.0 05/19/2010 1024    CMP     Component Value Date/Time   NA 139 05/16/2022 1425   K 4.3 05/16/2022 1425   CL 101 05/16/2022 1425   CO2 25 05/16/2022 1425   GLUCOSE 89 05/16/2022 1425   GLUCOSE 96 09/16/2014 1549   BUN 12 05/16/2022 1425   CREATININE 0.90 05/16/2022 1425   CREATININE 0.76 09/16/2014 1549   CALCIUM 9.4 05/16/2022 1425   PROT 6.2 12/29/2020 1444   ALBUMIN 4.2 12/29/2020 1444   AST 16 12/29/2020 1444   ALT 16 12/29/2020 1444   ALKPHOS 83  12/29/2020 1444   BILITOT 0.7 12/29/2020 1444   GFRNONAA 86 09/10/2019 1423   GFRNONAA >89 10/02/2012 1521   GFRAA 99 09/10/2019 1423   GFRAA >89 10/02/2012 1521     Assessment/Plan:   77 year old male history of severe mitral valve prolapse s/p repair 2012, CAD (2012), A-fib on Eliquis, hypertension, presents for evaluation of positive Cologuard with rare intermittent rectal bleeding and history of colon polyps on colonoscopy 25 years ago  History of colon polyps Positive Cologuard Rectal bleeding Cologuard was likely positive with his intermittent rectal bleeding which sounds hemorrhoidal as it is only on the tissue paper.  Also could have been positive with his history of colon polyps. he did not history of colon polyps on his colonoscopy age 66 and has not had a follow-up.  Patient is on Eliquis.  He appears much younger than stated age and I think he would be an adequate candidate for repeat colonoscopy.  Scheduling may be difficult for him as he takes care of his wife full-time.  - Schedule colonoscopy, he may have to call us back to schedule - I thoroughly discussed the procedure with the patient (at bedside) to include nature of the procedure, alternatives, benefits, and risks (including but not limited to bleeding, infection, perforation, anesthesia/cardiac pulmonary complications).  Patient verbalized understanding and gave verbal consent to proceed with procedure. - Obtain blood thinner sees from cardiologist and hold Eliquis for 2 days - Can  be performed at the [facility] with adequate ejection fraction  Long term use of anticoagulation Afib On eliquis, will hold for procedure with blood thinner with cardiology blood thinner cease  Patient was assigned to Dr. Barron Alvine today  Boone Master, PA-C [facility] 12/27/2022, 8:53 AM  Cc: Caro Laroche, DO

## 2022-12-29 NOTE — Telephone Encounter (Signed)
Patient with diagnosis of PAF on Eliquis for anticoagulation.    Procedure:  Colonoscopy  Date of procedure:  03/08/23    CHA2DS2-VASc Score = 2   This indicates a 2.2% annual risk of stroke. The patient's score is based upon: CHF History: 0 HTN History: 0 Diabetes History: 0 Stroke History: 0 Vascular Disease History: 0 Age Score: 2 Gender Score: 0    CrCl >90 mL/min Platelet count 159 k    Per office protocol, patient can hold Eliquis for 2 days prior to procedure.     **This guidance is not considered finalized until pre-operative APP has relayed final recommendations.**

## 2022-12-29 NOTE — Progress Notes (Deleted)
  Cardiology Office Note:  .   Date:  12/29/2022  ID:  Kenneth Mclaughlin, DOB 1945-10-24, MRN [id number] PCP: Caro Laroche, DO  [facility] Providers Cardiologist:  Verne Carrow, MD {}   }   History of Present Illness: .   Kenneth Mclaughlin is a 77 y.o. male with history of atrial fibrillation, CAD, severe MR s/p mitral valve repair here today to re-establish cardiology care. He has not been seen in our office since 2017. He was found to have severe MR April 2012. He underwent cardiac cath 05/23/10 which showed mild non-obstructive CAD and mild LV dysfunction with EF of 50%. TEE 05/13/10 with evidence of flail anterior mitral leaflet. He has undergone mitral valve repair per Dr. Cornelius Moras May 2012 last seen in the office by Dr. Clifton James on 08/17/2021.  At that time he was stable from a cardiac standpoint and SBE prophylaxis was continued to be recommended due mitral valve repair.  ROS: ***  Studies Reviewed: .        *** EKG Interpretation Date/Time:    Ventricular Rate:    PR Interval:    QRS Duration:    QT Interval:    QTC Calculation:   R Axis:      Text Interpretation:      Physical Exam:   VS:  There were no vitals taken for this visit.   Wt Readings from Last 3 Encounters:  12/27/22 205 lb (93 kg)  08/21/22 203 lb 6.4 oz (92.3 kg)  07/24/22 204 lb (92.5 kg)    GEN: Well nourished, well developed in no acute distress NECK: No JVD; No carotid bruits CARDIAC: ***RRR, no murmurs, rubs, gallops RESPIRATORY:  Clear to auscultation without rales, wheezing or rhonchi  ABDOMEN: Soft, non-tender, non-distended EXTREMITIES:  No edema; No deformity   ASSESSMENT AND PLAN: .   ***    {Are you ordering a CV Procedure (e.g. stress test, cath, DCCV, TEE, etc)?   Press F2        :[id number]}    Signed, Bettey Mare. Liborio Nixon, ANP, AACC

## 2022-12-29 NOTE — Telephone Encounter (Signed)
   Name: Kenneth Mclaughlin  DOB: Jan 09, 1946  MRN: [id number]  Primary Cardiologist: Verne Carrow, MD  Chart reviewed as part of pre-operative protocol coverage. The patient has an upcoming visit scheduled with Joni Reining DNP, NP on 01/04/2023 at which time clearance can be addressed in case there are any issues that would impact surgical recommendations.  ColonoscopyIs not scheduled until 03/08/2023 as below. I added preop FYI to appointment note so that provider is aware to address at time of outpatient visit.  Per office protocol the cardiology provider should forward their finalized clearance decision and recommendations regarding antiplatelet therapy to the requesting party below.    Per office protocol, patient can hold Eliquis for 2 days prior to procedure.    I will route this message as FYI to requesting party and remove this message from the preop box as separate preop APP input not needed at this time.   Please call with any questions.  Joni Reining, NP  12/29/2022, 12:06 PM

## 2023-01-01 ENCOUNTER — Other Ambulatory Visit: Payer: Self-pay

## 2023-01-01 DIAGNOSIS — E785 Hyperlipidemia, unspecified: Secondary | ICD-10-CM

## 2023-01-01 MED ORDER — ROSUVASTATIN CALCIUM 10 MG PO TABS: 10.0000 mg | ORAL_TABLET | Freq: Every day | ORAL | 3 refills | Status: DC

## 2023-01-04 ENCOUNTER — Ambulatory Visit: Payer: PPO | Attending: Physician Assistant | Admitting: Physician Assistant

## 2023-01-04 ENCOUNTER — Encounter: Payer: Self-pay | Admitting: Physician Assistant

## 2023-01-04 ENCOUNTER — Ambulatory Visit: Payer: PPO | Admitting: Adult Health

## 2023-01-04 VITALS — BP 136/80 | HR 87 | Ht 73.0 in | Wt 204.6 lb

## 2023-01-04 DIAGNOSIS — Z9889 Other specified postprocedural states: Secondary | ICD-10-CM

## 2023-01-04 DIAGNOSIS — I2510 Atherosclerotic heart disease of native coronary artery without angina pectoris: Secondary | ICD-10-CM

## 2023-01-04 DIAGNOSIS — I4821 Permanent atrial fibrillation: Secondary | ICD-10-CM

## 2023-01-04 MED ORDER — AMOXICILLIN 500 MG PO CAPS: ORAL_CAPSULE | ORAL | 5 refills | Status: AC

## 2023-01-04 NOTE — Patient Instructions (Signed)
Medication Instructions:  NO CHANGES *If you need a refill on your cardiac medications before your next appointment, please call your pharmacy*   Lab Work: NO LABS If you have labs (blood work) drawn today and your tests are completely normal, you will receive your results only by: MyChart Message (if you have MyChart) OR A paper copy in the mail If you have any lab test that is abnormal or we need to change your treatment, we will call you to review the results.   Testing/Procedures:[address] - RIGHT BEFORE 1 YEAR FOLLOW UP Your physician has requested that you have an echocardiogram. Echocardiography is a painless test that uses sound waves to create images of your heart. It provides your doctor with information about the size and shape of your heart and how well your heart's chambers and valves are working. This procedure takes approximately one hour. There are no restrictions for this procedure. Please do NOT wear cologne, perfume, aftershave, or lotions (deodorant is allowed). Please arrive 15 minutes prior to your appointment time.  Please note: We ask at that you not bring children with you during ultrasound (echo/ vascular) testing. Due to room size and safety concerns, children are not allowed in the ultrasound rooms during exams. Our front office staff cannot provide observation of children in our lobby area while testing is being conducted. An adult accompanying a patient to their appointment will only be allowed in the ultrasound room at the discretion of the ultrasound technician under special circumstances. We apologize for any inconvenience.    Follow-Up: At [facility], you and your health needs are our priority.  As part of our continuing mission to provide you with exceptional heart care, we have created designated Provider Care Teams.  These Care Teams include your primary Cardiologist (physician) and Advanced Practice Providers (APPs -  Physician  Assistants and Nurse Practitioners) who all work together to provide you with the care you need, when you need it.  Your next appointment:   12 month(s) AFTER ECHOCARDIOGRAM  Provider:   Verne Carrow, MD

## 2023-01-04 NOTE — Progress Notes (Signed)
Cardiology Office Note:  .   Date:  01/04/2023  ID:  KANTRELL REEVES, DOB 1945/04/09, MRN [id number] PCP: Caro Laroche, DO  [facility] Providers Cardiologist:  Verne Carrow, MD     History of Present Illness: .   Kenneth Mclaughlin is a 77 y.o. male with past medical history of permanent atrial fibrillation, CAD, severe MR s/p MR repair.  He was diagnosed with severe MR in April 2012 and underwent cardiac catheterization on 05/23/2010 that showed mild nonobstructive CAD and a mild LV dysfunction with EF of 50%.  Subsequent TEE performed on 05/13/2010 showed evidence of flail anterior mitral valve leaflet.  He underwent mitral valve repair by Dr. Cornelius Moras in May 2012.  He did develop postop atrial fibrillation and was started on amiodarone and Coumadin but both were stopped in September 2012 due to lack of recurrence.  Echocardiogram in January 2023 showed EF 50 to 55%, normal-appearing mitral valve repair.  He unfortunately was diagnosed with recurrent atrial fibrillation by primary care provider in December 2022 and was started on Eliquis.  He was last seen by Dr. Clifton James in August 2023 at which time he was doing well.  He is not on any AV nodal blocking agent.  The patient reports no issues with his current medication regimen and denies any swelling in the legs. He expresses a desire to increase his exercise regimen and reports no exertional symptoms. The patient also reports a significant cost increase for his medication towards the end of the year due to the "donut hole" in his insurance coverage.  The patient has a history of mitral valve repair and is advised to take antibiotics prior to any dental procedures to prevent endocarditis. He has been prescribed amoxicillin for this purpose.  ROS:   He denies chest pain, palpitations, dyspnea, pnd, orthopnea, n, v, dizziness, syncope, edema, weight gain, or early satiety. All other systems reviewed and are otherwise negative except as  noted above.    Studies Reviewed: .        Cardiac Studies & Procedures      ECHOCARDIOGRAM  ECHOCARDIOGRAM COMPLETE 02/10/2021  Narrative ECHOCARDIOGRAM REPORT    Patient Name:   Kenneth Mclaughlin Date of Exam: 02/10/2021 Medical Rec #:  [id number]      Height:       73.0 in Accession #:    [id number]     Weight:       204.4 lb Date of Birth:  [birth year]      BSA:          2.172 m Patient Age:    75 years       BP:           128/65 mmHg Patient Gender: M              HR:           61 bpm. Exam Location:  [address]  Procedure: 2D Echo, Cardiac Doppler and Color Doppler  Indications:    I 05.9 MV REPAIR  History:        Patient has prior history of Echocardiogram examinations, most recent 03/11/2015. CAD; Risk Factors:Dyslipidemia. SBE /MV REPAIR /.  Mitral Valve: prosthetic annuloplasty ring valve is present in the mitral position.  Sonographer:    Festus Barren Referring Phys: Kathleene Hazel  IMPRESSIONS   1. Left ventricular ejection fraction, by estimation, is 50 to 55%. The left ventricle has low normal function. The left ventricle has no regional  wall motion abnormalities. The left ventricular internal cavity size was mildly dilated. Diastolic function indeterminant due to MVR. 2. Right ventricular systolic function is normal. The right ventricular size is mildly enlarged. There is mildly elevated pulmonary artery systolic pressure. The estimated right ventricular systolic pressure is 42.1 mmHg. 3. Left atrial size was severely dilated. 4. Right atrial size was severely dilated. 5. The mitral valve has been repaired/replaced. There is a prosthetic annuloplasty ring present in the mitral position. Mean gradient at HR 60bpm. MVA by VTI 1.9cm2. There is trivial mitral regurgitation. 6. The aortic valve is tricuspid. There is mild thickening of the aortic valve. Aortic valve regurgitation is trivial. Aortic valve sclerosis is present, with no evidence of  aortic valve stenosis. 7. Frequent PVCs throughout study. 8. The inferior vena cava is normal in size with <50% respiratory variability, suggesting right atrial pressure of 8 mmHg.  Comparison(s): Compared to prior TTE report in 2017, the EF appears slightly lower at 50-55% (previously 55-60%). Previous MV mean gradient .  FINDINGS Left Ventricle: Left ventricular ejection fraction, by estimation, is 50 to 55%. The left ventricle has low normal function. The left ventricle has no regional wall motion abnormalities. The left ventricular internal cavity size was mildly dilated. There is no left ventricular hypertrophy. Abnormal (paradoxical) septal motion consistent with post-operative status. Diastolic function indeterminant due to MVR.  Right Ventricle: The right ventricular size is mildly enlarged. No increase in right ventricular wall thickness. Right ventricular systolic function is normal. There is mildly elevated pulmonary artery systolic pressure. The tricuspid regurgitant velocity is 2.92 m/s, and with an assumed right atrial pressure of 8 mmHg, the estimated right ventricular systolic pressure is 42.1 mmHg.  Left Atrium: Left atrial size was severely dilated.  Right Atrium: Right atrial size was severely dilated.  Pericardium: There is no evidence of pericardial effusion.  Mitral Valve: The mitral valve has been repaired/replaced. Trivial mitral valve regurgitation. There is a prosthetic annuloplasty ring present in the mitral position. Mean gradient at HR 60bpm.  Tricuspid Valve: The tricuspid valve is normal in structure. Tricuspid valve regurgitation is mild.  Aortic Valve: The aortic valve is tricuspid. There is mild thickening of the aortic valve. Aortic valve regurgitation is trivial. Aortic regurgitation PHT measures 244 msec. Aortic valve sclerosis is present, with no evidence of aortic valve stenosis. Aortic valve mean gradient measures 5.0 mmHg. Aortic valve peak  gradient measures 8.9 mmHg. Aortic valve area, by VTI measures 3.88 cm.  Pulmonic Valve: The pulmonic valve was normal in structure. Pulmonic valve regurgitation is trivial.  Aorta: The aortic root and ascending aorta are structurally normal, with no evidence of dilitation.  Venous: The inferior vena cava is normal in size with less than 50% respiratory variability, suggesting right atrial pressure of 8 mmHg.  IAS/Shunts: The atrial septum is grossly normal.   LEFT VENTRICLE PLAX 2D LVIDd:         5.80 cm LVIDs:         4.90 cm LV PW:         1.20 cm LV IVS:        1.00 cm LVOT diam:     2.70 cm LV SV:         121 LV SV Index:   56 LVOT Area:     5.73 cm  LV Volumes (MOD) LV vol d, MOD A2C: 144.0 ml LV vol d, MOD A4C: 135.0 ml LV vol s, MOD A2C: 65.7 ml LV vol s,  MOD A4C: 60.8 ml LV SV MOD A2C:     78.3 ml LV SV MOD A4C:     135.0 ml LV SV MOD BP:      78.7 ml  RIGHT VENTRICLE             IVC RV Basal diam:  5.40 cm     IVC diam: 1.40 cm RV Mid diam:    4.10 cm RV S prime:     12.90 cm/s RVOT diam:      2.70 cm TAPSE (M-mode): 1.3 cm  LEFT ATRIUM              Index        RIGHT ATRIUM           Index LA diam:        4.30 cm  1.98 cm/m   RA Area:     25.00 cm LA Vol (A2C):   120.0 ml 55.26 ml/m  RA Volume:   89.30 ml  41.12 ml/m LA Vol (A4C):   129.0 ml 59.40 ml/m LA Biplane Vol: 126.0 ml 58.02 ml/m AORTIC VALVE                     PULMONIC VALVE AV Area (Vmax):    3.88 cm      PV Vmax:       0.65 m/s AV Area (Vmean):   3.53 cm      PV Vmean:      42.200 cm/s AV Area (VTI):     3.88 cm      PV VTI:        0.139 m AV Vmax:           149.00 cm/s   PV Peak grad:  1.7 mmHg AV Vmean:          103.000 cm/s  PV Mean grad:  1.0 mmHg AV VTI:            0.313 m AV Peak Grad:      8.9 mmHg AV Mean Grad:      5.0 mmHg LVOT Vmax:         101.00 cm/s LVOT Vmean:        63.500 cm/s LVOT VTI:          0.212 m LVOT/AV VTI ratio: 0.68 AI PHT:            244  msec  AORTA Ao Root diam: 3.50 cm Ao Asc diam:  2.90 cm  MITRAL VALVE                 TRICUSPID VALVE MV Area VTI:  1.12 cm       TR Peak grad:   34.1 mmHg MV Peak grad: 52.4 mmHg      TR Vmax:        292.00 cm/s MV Mean grad: 38.0 mmHg MV Vmax:      3.62 m/s       SHUNTS MV Vmean:     297.0 cm/s     Systemic VTI:  0.21 m MR Peak grad:   58.7 mmHg    Systemic Diam: 2.70 cm MR Mean grad:   39.0 mmHg    Pulmonic Diam: 2.70 cm MR Vmax:        383.00 cm/s MR Vmean:       293.0 cm/s MR PISA:        4.02 cm MR PISA Radius: 0.80 cm  Laurance Flatten MD Electronically signed  by Laurance Flatten MD Signature Date/Time: 02/10/2021/3:23:55 PM    Final             Risk Assessment/Calculations:    CHA2DS2-VASc Score = 2   This indicates a 2.2% annual risk of stroke. The patient's score is based upon: CHF History: 0 HTN History: 0 Diabetes History: 0 Stroke History: 0 Vascular Disease History: 0 Age Score: 2 Gender Score: 0            Physical Exam:   VS:  BP 136/80   Pulse 87   Ht 6\' 1"  (1.854 m)   Wt 204 lb 9.6 oz (92.8 kg)   SpO2 96%   BMI 26.99 kg/m    Wt Readings from Last 3 Encounters:  01/04/23 204 lb 9.6 oz (92.8 kg)  12/27/22 205 lb (93 kg)  08/21/22 203 lb 6.4 oz (92.3 kg)    GEN: Well nourished, well developed in no acute distress NECK: No JVD; No carotid bruits CARDIAC: Irregularly irregular, no murmurs, rubs, gallops RESPIRATORY:  Clear to auscultation without rales, wheezing or rhonchi  ABDOMEN: Soft, non-tender, non-distended EXTREMITIES:  No edema; No deformity   ASSESSMENT AND PLAN: .    Permanent Atrial Fibrillation Heart rate is self-controlled and patient is asymptomatic. Stroke risk is managed with Eliquis. -Continue Eliquis for stroke prevention. -Continue monitoring for symptoms of AFib.  Mitral Valve Repair Successful repair in May 2012 with no current symptoms of valve dysfunction. Last echocardiogram in January 2023 showed normal  appearing mitral valve repair. -Order echocardiogram prior to next visit with Dr. Clifton James in one year to assess valve function.  Coronary Artery Disease Mild plaque in the left anterior descending artery and left circumflex artery noted in 2012. No current symptoms of CAD. -Continue monitoring for symptoms of CAD.  Antibiotic Prophylaxis for Dental Procedures History of mitral valve repair necessitates antibiotic prophylaxis prior to dental procedures to prevent endocarditis. -Prescribe Amoxicillin 2000mg  to be taken 30 minutes prior to dental procedures.        Dispo: Obtain echocardiogram 1 week prior to next follow-up with Dr. Clifton James.  Follow-up with Dr. Clifton James in 1 year  Signed, Azalee Course, [place]

## 2023-01-30 ENCOUNTER — Other Ambulatory Visit: Payer: Self-pay | Admitting: Cardiovascular Disease

## 2023-01-30 DIAGNOSIS — I4821 Permanent atrial fibrillation: Secondary | ICD-10-CM

## 2023-01-30 DIAGNOSIS — Z9889 Other specified postprocedural states: Secondary | ICD-10-CM

## 2023-01-30 NOTE — Telephone Encounter (Signed)
 Eliquis 5mg  refill request received. Patient is 78 years old, weight-92.8kg, Crea-0.90 on 05/16/22, Diagnosis-Afib, and last seen by Azalee Course on 01/04/23. Dose is appropriate based on dosing criteria. Will send in refill to requested pharmacy.

## 2023-02-27 ENCOUNTER — Encounter: Payer: Self-pay | Admitting: Gastroenterology

## 2023-03-08 ENCOUNTER — Encounter: Payer: PPO | Admitting: Gastroenterology

## 2023-04-23 ENCOUNTER — Telehealth: Payer: Self-pay | Admitting: Gastroenterology

## 2023-04-23 NOTE — Telephone Encounter (Signed)
 Patient needing to be further advise on prep . And medication holds.

## 2023-04-23 NOTE — Telephone Encounter (Signed)
 Patient returning call. Please advise

## 2023-04-23 NOTE — Telephone Encounter (Signed)
 Attempted to contact patient and left a voicemail to return call regarding prep and medication holds.

## 2023-04-26 ENCOUNTER — Ambulatory Visit: Payer: PPO | Admitting: Gastroenterology

## 2023-04-26 ENCOUNTER — Encounter: Payer: Self-pay | Admitting: Gastroenterology

## 2023-04-26 VITALS — BP 116/74 | HR 82 | Temp 97.7°F | Resp 13 | Ht 73.0 in | Wt 205.0 lb

## 2023-04-26 DIAGNOSIS — D125 Benign neoplasm of sigmoid colon: Secondary | ICD-10-CM | POA: Diagnosis not present

## 2023-04-26 DIAGNOSIS — D123 Benign neoplasm of transverse colon: Secondary | ICD-10-CM

## 2023-04-26 DIAGNOSIS — R195 Other fecal abnormalities: Secondary | ICD-10-CM | POA: Diagnosis not present

## 2023-04-26 DIAGNOSIS — Z1211 Encounter for screening for malignant neoplasm of colon: Secondary | ICD-10-CM | POA: Diagnosis not present

## 2023-04-26 DIAGNOSIS — Z860100 Personal history of colon polyps, unspecified: Secondary | ICD-10-CM

## 2023-04-26 DIAGNOSIS — K5730 Diverticulosis of large intestine without perforation or abscess without bleeding: Secondary | ICD-10-CM

## 2023-04-26 DIAGNOSIS — D124 Benign neoplasm of descending colon: Secondary | ICD-10-CM

## 2023-04-26 DIAGNOSIS — K635 Polyp of colon: Secondary | ICD-10-CM

## 2023-04-26 DIAGNOSIS — D122 Benign neoplasm of ascending colon: Secondary | ICD-10-CM | POA: Diagnosis not present

## 2023-04-26 DIAGNOSIS — D1339 Benign neoplasm of other parts of small intestine: Secondary | ICD-10-CM | POA: Diagnosis not present

## 2023-04-26 MED ORDER — SODIUM CHLORIDE 0.9 % IV SOLN: 500.0000 mL | Freq: Once | INTRAVENOUS | Status: DC

## 2023-04-26 NOTE — Progress Notes (Signed)
 GASTROENTEROLOGY PROCEDURE H&P NOTE   Primary Care Physician: Caro Laroche, DO    Reason for Procedure:  Colon polyp surveillance, positive Cologuard  Plan:    Colonoscopy  Patient is appropriate for endoscopic procedure(s) in the ambulatory ([facility]) setting.  The nature of the procedure, as well as the risks, benefits, and alternatives were carefully and thoroughly reviewed with the patient. Ample time for discussion and questions allowed. The patient understood, was satisfied, and agreed to proceed.     HPI: Kenneth Mclaughlin is a 78 y.o. male who presents for colonoscopy for ongoing colon polyp surveillance and colon cancer screening in the setting of recent positive Cologuard.  No active GI symptoms.    Last colonoscopy was 20+ years ago in [facility] and notable for polyps per patient.  No report available for review.  More recently had a positive Cologuard in 07/2022.  Has been holding Eliquis x 2 days for procedure today.    Past Medical History:  Diagnosis Date   Atrial fibrillation (HCC)    Post-op. No recurrence.    Depression    Hypercholesteremia 04/21/2011   Irregularly irregular cardiac rhythm 12/29/2020   Mitral regurgitation    Myxomatous degeneration of mitral valve    Tobacco abuse     Past Surgical History:  Procedure Laterality Date   KNEE ARTHROSCOPY Left 06/2015   MITRAL VALVE REPAIR  06/14/2010   right minithoracotomy for complex valvuloplasty with 30mm ring annuloplasty - Dr. Cornelius Moras   SHOULDER SURGERY     TONSILLECTOMY      Prior to Admission medications   Medication Sig Start Date End Date Taking? Authorizing Provider  amoxicillin (AMOXIL) 500 MG capsule Take 4 capsule (2000 mg) 30 min prior to dental procedure 01/04/23   Azalee Course, PA  apixaban (ELIQUIS) 5 MG TABS tablet TAKE 1 TABLET BY MOUTH TWICE A DAY 01/30/23   Kathleene Hazel, MD  Multiple Vitamin (MULTIVITAMIN) tablet Take 1 tablet by mouth daily.      [provider]   Na Sulfate-K Sulfate-Mg Sulf 17.5-3.13-1.6 GM/177ML SOLN See admin instructions. 12/27/22   [provider]  rosuvastatin (CRESTOR) 10 MG tablet Take 1 tablet (10 mg total) by mouth daily. 01/01/23   Caro Laroche, DO    Current Outpatient Medications  Medication Sig Dispense Refill   amoxicillin (AMOXIL) 500 MG capsule Take 4 capsule (2000 mg) 30 min prior to dental procedure 4 capsule 5   apixaban (ELIQUIS) 5 MG TABS tablet TAKE 1 TABLET BY MOUTH TWICE A DAY 180 tablet 1   Multiple Vitamin (MULTIVITAMIN) tablet Take 1 tablet by mouth daily.       Na Sulfate-K Sulfate-Mg Sulf 17.5-3.13-1.6 GM/177ML SOLN See admin instructions.     rosuvastatin (CRESTOR) 10 MG tablet Take 1 tablet (10 mg total) by mouth daily. 90 tablet 3   Current Facility-Administered Medications  Medication Dose Route Frequency Provider Last Rate Last Admin   0.9 %  sodium chloride infusion  500 mL Intravenous Once Terresa Marlett V, DO        Allergies as of 04/26/2023   (No Known Allergies)    Family History  Problem Relation Age of Onset   Heart failure Mother    Dementia Mother    Cancer Father        Lung   COPD Father    Heart disease Brother    Heart disease Brother    Colon cancer Neg Hx    Esophageal cancer Neg Hx  Stomach cancer Neg Hx     Social History   Socioeconomic History   Marital status: Married    Spouse name: Not on file   Number of children: 2   Years of education: Not on file   Highest education level: Bachelor's degree (e.g., BA, AB, BS)  Occupational History   Occupation: Retired  Tobacco Use   Smoking status: Former    Current packs/day: 0.00    Average packs/day: 1 pack/day for 8.0 years (8.0 ttl pk-yrs)    Types: Cigarettes    Start date: 03/14/1969    Quit date: 03/14/1977    Years since quitting: 46.1   Smokeless tobacco: Never  Vaping Use   Vaping status: Never Used  Substance and Sexual Activity   Alcohol use: Yes    Comment: weekly   Drug  use: No   Sexual activity: Yes  Other Topics Concern   Not on file  Social History Narrative   Current Social History 12/20/2016        Patient lives with Wife in one level home 12/20/2016   Transportation: Patient has own vehicle and drives himself 12/20/2016   Important Relationships Wife, sons and brothers 12/20/2016    Pets: Lab/boxer mix Systems developer) and Bichon Norberto Sorenson) 12/20/2016   Education / Work:  BS/ Works at [employer]; standing 8-9 hours per day12/05/2016   Interests / Fun: Being active, art, music (singing), hiking (when not in pain from left foot bunion) 12/20/2016   Current Stressors: Finances 12/20/2016   Religious / Personal Beliefs: "I believe in God." 12/20/2016      L. Leward Quan, RN, BSN       Primary caregiver for wife who is non ambulatory                                                                                                 Social Drivers of Health   Financial Resource Strain: Low Risk  (07/24/2022)   Overall Financial Resource Strain (CARDIA)    Difficulty of Paying Living Expenses: Not hard at all  Food Insecurity: No Food Insecurity (07/24/2022)   Hunger Vital Sign    Worried About Running Out of Food in the Last Year: Never true    Ran Out of Food in the Last Year: Never true  Transportation Needs: No Transportation Needs (07/24/2022)   PRAPARE - Administrator, Civil Service (Medical): No    Lack of Transportation (Non-Medical): No  Physical Activity: Sufficiently Active (07/24/2022)   Exercise Vital Sign    Days of Exercise per Week: 5 days    Minutes of Exercise per Session: 30 min  Stress: No Stress Concern Present (07/24/2022)   [employer] of Occupational Health - Occupational Stress Questionnaire    Feeling of Stress : Only a little  Social Connections: Moderately Isolated (07/24/2022)   Social Connection and Isolation Panel [NHANES]    Frequency of Communication with Friends and Family: More than three times a week    Frequency of Social  Gatherings with Friends and Family: Three times a week    Attends Religious Services: Never  Active Member of Clubs or Organizations: No    Attends Banker Meetings: Never    Marital Status: Married  Catering manager Violence: Not At Risk (07/24/2022)   Humiliation, Afraid, Rape, and Kick questionnaire    Fear of Current or Ex-Partner: No    Emotionally Abused: No    Physically Abused: No    Sexually Abused: No    Physical Exam: Vital signs in last 24 hours: @There  were no vitals taken for this visit. GEN: NAD EYE: Sclerae anicteric ENT: MMM CV: Non-tachycardic Pulm: CTA b/l GI: Soft, NT/ND NEURO:  Alert & Oriented x 3   Doristine Locks, DO [facility]   04/26/2023 10:45 AM

## 2023-04-26 NOTE — Progress Notes (Signed)
 Called to room to assist during endoscopic procedure.  Patient ID and intended procedure confirmed with present staff. Received instructions for my participation in the procedure from the performing physician.

## 2023-04-26 NOTE — Patient Instructions (Signed)
 Please read handouts provided. Continue present medications. Await pathology results. Resume Eliquis ( apixaban ) at prior dose tomorrow. Return to GI clinic as needed. Resume previous diet.   YOU HAD AN ENDOSCOPIC PROCEDURE TODAY AT THE [facility]:   Refer to the procedure report that was given to you for any specific questions about what was found during the examination.  If the procedure report does not answer your questions, please call your gastroenterologist to clarify.  If you requested that your care partner not be given the details of your procedure findings, then the procedure report has been included in a sealed envelope for you to review at your convenience later.  YOU SHOULD EXPECT: Some feelings of bloating in the abdomen. Passage of more gas than usual.  Walking can help get rid of the air that was put into your GI tract during the procedure and reduce the bloating. If you had a lower endoscopy (such as a colonoscopy or flexible sigmoidoscopy) you may notice spotting of blood in your stool or on the toilet paper. If you underwent a bowel prep for your procedure, you may not have a normal bowel movement for a few days.  Please Note:  You might notice some irritation and congestion in your nose or some drainage.  This is from the oxygen used during your procedure.  There is no need for concern and it should clear up in a day or so.  SYMPTOMS TO REPORT IMMEDIATELY:  Following lower endoscopy (colonoscopy or flexible sigmoidoscopy):  Excessive amounts of blood in the stool  Significant tenderness or worsening of abdominal pains  Swelling of the abdomen that is new, acute  Fever of 100F or higher.  For urgent or emergent issues, a gastroenterologist can be reached at any hour by calling [phone]. Do not use MyChart messaging for urgent concerns.    DIET:  We do recommend a small meal at first, but then you may proceed to your regular diet.  Drink plenty of  fluids but you should avoid alcoholic beverages for 24 hours.  ACTIVITY:  You should plan to take it easy for the rest of today and you should NOT DRIVE or use heavy machinery until tomorrow (because of the sedation medicines used during the test).    FOLLOW UP: Our staff will call the number listed on your records the next business day following your procedure.  We will call around 7:15- 8:00 am to check on you and address any questions or concerns that you may have regarding the information given to you following your procedure. If we do not reach you, we will leave a message.     If any biopsies were taken you will be contacted by phone or by letter within the next 1-3 weeks.  Please call us at [phone] if you have not heard about the biopsies in 3 weeks.    SIGNATURES/CONFIDENTIALITY: You and/or your care partner have signed paperwork which will be entered into your electronic medical record.  These signatures attest to the fact that that the information above on your After Visit Summary has been reviewed and is understood.  Full responsibility of the confidentiality of this discharge information lies with you and/or your care-partner.

## 2023-04-26 NOTE — Progress Notes (Signed)
 Report to PACU, RN, vss, BBS= Clear.

## 2023-04-26 NOTE — Op Note (Signed)
 [facility] Patient Name: Kenneth Mclaughlin Procedure Date: 04/26/2023 10:32 AM MRN: [id number] Endoscopist: Doristine Locks , MD, [id number] Age: 78 Referring MD:  Date of Birth: April 18, 1945 Gender: Male Account #: [id number] Procedure:                Colonoscopy Indications:              Screening for colorectal malignant neoplasm (last                            colonoscopy was more than 10 years ago).                           Recent positive Cologuard test Medicines:                Monitored Anesthesia Care Procedure:                Pre-Anesthesia Assessment:                           - Prior to the procedure, a History and Physical                            was performed, and patient medications and                            allergies were reviewed. The patient's tolerance of                            previous anesthesia was also reviewed. The risks                            and benefits of the procedure and the sedation                            options and risks were discussed with the patient.                            All questions were answered, and informed consent                            was obtained. Prior Anticoagulants: The patient has                            taken Eliquis (apixaban), last dose was 2 days                            prior to procedure. ASA Grade Assessment: III - A                            patient with severe systemic disease. After                            reviewing the risks and benefits, the patient was  deemed in satisfactory condition to undergo the                            procedure.                           After obtaining informed consent, the colonoscope                            was passed under direct vision. Throughout the                            procedure, the patient's blood pressure, pulse, and                            oxygen saturations were monitored continuously. The                             Olympus Scope SN [id number] was introduced through the                            anus and advanced to the the cecum, identified by                            appendiceal orifice and ileocecal valve. The                            colonoscopy was performed without difficulty. The                            patient tolerated the procedure well. The quality                            of the bowel preparation was good. The ileocecal                            valve, appendiceal orifice, and rectum were                            photographed. Scope In: 11:09:51 AM Scope Out: 11:37:21 AM Scope Withdrawal Time: 0 hours 22 minutes 54 seconds  Total Procedure Duration: 0 hours 27 minutes 30 seconds  Findings:                 The perianal and digital rectal examinations were                            normal.                           Seven sessile polyps were found in the transverse                            colon and ascending colon. The polyps were 3 to 8  mm in size. These polyps were removed with a cold                            snare. Resection and retrieval were complete.                            Estimated blood loss was minimal.                           Five sessile polyps were found in the sigmoid colon                            and descending colon. The polyps were 3 to 6 mm in                            size. These polyps were removed with a cold snare.                            Resection and retrieval were complete. Estimated                            blood loss was minimal.                           Multiple large-mouthed and small-mouthed                            diverticula were found in the sigmoid colon and                            descending colon.                           The retroflexed view of the distal rectum and anal                            verge was normal and showed no anal or rectal                             abnormalities. Complications:            No immediate complications. Estimated Blood Loss:     Estimated blood loss was minimal. Impression:               - Seven 3 to 8 mm polyps in the transverse colon                            and in the ascending colon, removed with a cold                            snare. Resected and retrieved.                           - Five 3 to 6 mm polyps in the sigmoid colon and  in                            the descending colon, removed with a cold snare.                            Resected and retrieved.                           - Diverticulosis in the sigmoid colon and in the                            descending colon.                           - The distal rectum and anal verge are normal on                            retroflexion view. Recommendation:           - Patient has a contact number available for                            emergencies. The signs and symptoms of potential                            delayed complications were discussed with the                            patient. Return to normal activities tomorrow.                            Written discharge instructions were provided to the                            patient.                           - Resume previous diet.                           - Continue present medications.                           - Resume Eliquis (apixaban) at prior dose tomorrow.                           - Await pathology results.                           - Repeat colonoscopy for surveillance based on                            pathology results.                           - Return to GI clinic PRN. Doristine Locks, MD 04/26/2023 11:43:48 AM

## 2023-04-27 ENCOUNTER — Telehealth: Payer: Self-pay | Admitting: *Deleted

## 2023-04-27 NOTE — Telephone Encounter (Signed)
 Post procedure follow up call placed, no answer and left VM.

## 2023-04-30 LAB — SURGICAL PATHOLOGY

## 2023-05-11 ENCOUNTER — Encounter: Payer: Self-pay | Admitting: Gastroenterology

## 2023-07-26 ENCOUNTER — Other Ambulatory Visit: Payer: Self-pay | Admitting: Cardiovascular Disease

## 2023-07-26 DIAGNOSIS — I4821 Permanent atrial fibrillation: Secondary | ICD-10-CM

## 2023-07-26 DIAGNOSIS — Z9889 Other specified postprocedural states: Secondary | ICD-10-CM

## 2023-07-26 NOTE — Telephone Encounter (Signed)
 Prescription refill request for Eliquis  received. Indication: afib  Last office visit: Janene 01/04/2023 Scr:0.90, 05/16/2022 Age: 78 yo  Weight: 93 kg   Pt is overdue for blood work.

## 2023-07-26 NOTE — Telephone Encounter (Signed)
 Called pt to make him aware of overdue labs, no answer. Left message on voicemail.

## 2023-07-26 NOTE — Telephone Encounter (Signed)
 Called and spoke to pt and he stated that he could come to the [facility] office for blood work either Monday or Tuesday of next week. Informed him that the [facility] would be on the 1st floor and that I would go ahead and send in a 1 month supply so that he would not run out.

## 2023-07-31 LAB — CBC
Hematocrit: 44.8 % (ref 37.5–51.0)
Hemoglobin: 15.6 g/dL (ref 13.0–17.7)
MCH: 34.2 pg — ABNORMAL HIGH (ref 26.6–33.0)
MCHC: 34.8 g/dL (ref 31.5–35.7)
MCV: 98 fL — ABNORMAL HIGH (ref 79–97)
Platelets: 164 x10E3/uL (ref 150–450)
RBC: 4.56 x10E6/uL (ref 4.14–5.80)
RDW: 12.4 % (ref 11.6–15.4)
WBC: 5.4 x10E3/uL (ref 3.4–10.8)

## 2023-08-01 ENCOUNTER — Ambulatory Visit: Payer: Self-pay | Admitting: Cardiovascular Disease

## 2023-08-01 LAB — BASIC METABOLIC PANEL WITH GFR
BUN/Creatinine Ratio: 14 (ref 10–24)
BUN: 15 mg/dL (ref 8–27)
CO2: 22 mmol/L (ref 20–29)
Calcium: 9 mg/dL (ref 8.6–10.2)
Chloride: 100 mmol/L (ref 96–106)
Creatinine, Ser: 1.11 mg/dL (ref 0.76–1.27)
Glucose: 75 mg/dL (ref 70–99)
Potassium: 4.6 mmol/L (ref 3.5–5.2)
Sodium: 140 mmol/L (ref 134–144)
eGFR: 69 mL/min/1.73 (ref >59)

## 2023-08-02 ENCOUNTER — Ambulatory Visit: Payer: PPO

## 2023-08-02 ENCOUNTER — Telehealth: Payer: Self-pay

## 2023-08-02 VITALS — Ht 73.0 in | Wt 204.0 lb

## 2023-08-02 DIAGNOSIS — Z0000 Encounter for general adult medical examination without abnormal findings: Secondary | ICD-10-CM | POA: Diagnosis not present

## 2023-08-02 NOTE — Patient Instructions (Signed)
 Kenneth Mclaughlin , Thank you for taking time out of your busy schedule to complete your Annual Wellness Visit with me. I enjoyed our conversation and look forward to speaking with you again next year. I, as well as your care team,  appreciate your ongoing commitment to your health goals. Please review the following plan we discussed and let me know if I can assist you in the future. Your Game plan/ To Do List    Referrals: If you haven't heard from the office you've been referred to, please reach out to them at the phone provided.   Follow up Visits: Next Medicare AWV with our clinical staff: 08/04/2024 at 1:00 p.m. phone visit with Nurse Health Advisor   Have you seen your provider in the last 6 months (3 months if uncontrolled diabetes)? No Next Office Visit with your provider: Office will call patient to schedule appointment with Dr. Madelon  Clinician Recommendations:  Aim for 30 minutes of exercise or brisk walking, 6-8 glasses of water, and 5 servings of fruits and vegetables each day.       This is a list of the screening recommended for you and due dates:  Health Maintenance  Topic Date Due   DTaP/Tdap/Td vaccine (2 - Td or Tdap) 04/20/2021   COVID-19 Vaccine (4 - 2024-25 season) 09/17/2022   Flu Shot  08/17/2023   Colon Cancer Screening  04/25/2024   Medicare Annual Wellness Visit  08/01/2024   Pneumococcal Vaccine for age over 92  Completed   Hepatitis C Screening  Completed   Zoster (Shingles) Vaccine  Completed   Hepatitis B Vaccine  Aged Out   HPV Vaccine  Aged Out   Meningitis B Vaccine  Aged Out    Advanced directives: (Provided) Advance directive discussed with you today. I have provided a copy for you to complete at home and have notarized. Once this is complete, please bring a copy in to our office so we can scan it into your chart.  Advance Care Planning is important because it:  [x]  Makes sure you receive the medical care that is consistent with your values, goals, and  preferences  [x]  It provides guidance to your family and loved ones and reduces their decisional burden about whether or not they are making the right decisions based on your wishes.  Follow the link provided in your after visit summary or read over the paperwork we have mailed to you to help you started getting your Advance Directives in place. If you need assistance in completing these, please reach out to us  so that we can help you!  See attachments for Preventive Care and Fall Prevention Tips.

## 2023-08-02 NOTE — Telephone Encounter (Signed)
 Covering for Dr. Rumball.  Please have patient schedule follow up visit with Dr. Rumball. Has been nearly 1 year since he was seen here. This can be discussed at his follow up visit.  Thanks, Laymon JINNY Legions, MD

## 2023-08-02 NOTE — Progress Notes (Signed)
 Because this visit was a virtual/telehealth visit,  certain criteria was not obtained, such a blood pressure, CBG if applicable, and timed get up and go. Any medications not marked as taking were not mentioned during the medication reconciliation part of the visit. Any vitals not documented were not able to be obtained due to this being a telehealth visit or patient was unable to self-report a recent blood pressure reading due to a lack of equipment at home via telehealth. Vitals that have been documented are verbally provided by the patient.   Subjective:   Kenneth Mclaughlin is a 78 y.o. who presents for a Medicare Wellness preventive visit.  As a reminder, Annual Wellness Visits don't include a physical exam, and some assessments may be limited, especially if this visit is performed virtually. We may recommend an in-person follow-up visit with your provider if needed.  Visit Complete: Virtual I connected with  Kenneth Mclaughlin on 08/02/23 by a audio enabled telemedicine application and verified that I am speaking with the correct person using two identifiers.  Patient Location: Home  Provider Location: Home Office  I discussed the limitations of evaluation and management by telemedicine. The patient expressed understanding and agreed to proceed.  Vital Signs: Because this visit was a virtual/telehealth visit, some criteria may be missing or patient reported. Any vitals not documented were not able to be obtained and vitals that have been documented are patient reported.  VideoDeclined- This patient declined Librarian, academic. Therefore the visit was completed with audio only.  Persons Participating in Visit: Patient.  AWV Questionnaire: No: Patient Medicare AWV questionnaire was not completed prior to this visit.  Cardiac Risk Factors include: advanced age (>73men, >16 women);dyslipidemia;family history of premature cardiovascular disease;male gender      Objective:    Today's Vitals   08/02/23 1334  Weight: 204 lb (92.5 kg)  Height: 6' 1 (1.854 m)  PainSc: 0-No pain   Body mass index is 26.91 kg/m.     08/02/2023    1:40 PM 12/29/2020    1:46 PM 12/08/2019   10:42 AM 09/10/2019    1:40 PM 08/11/2019    2:14 PM 12/20/2016   10:36 AM 12/20/2016    9:28 AM  Advanced Directives  Does Patient Have a Medical Advance Directive? No No Yes No No No  No   Type of Surveyor, minerals;Living will      Copy of Healthcare Power of Attorney in Chart?   No - copy requested      Would patient like information on creating a medical advance directive? Yes (MAU/Ambulatory/Procedural Areas - Information given) No - Patient declined  No - Patient declined No - Patient declined Yes (MAU/Ambulatory/Procedural Areas - Information given)       Data saved with a previous flowsheet row definition    Current Medications (verified) Outpatient Encounter Medications as of 08/02/2023  Medication Sig   apixaban  (ELIQUIS ) 5 MG TABS tablet TAKE 1 TABLET BY MOUTH TWICE A DAY   Multiple Vitamin (MULTIVITAMIN) tablet Take 1 tablet by mouth daily.     rosuvastatin  (CRESTOR ) 10 MG tablet Take 1 tablet (10 mg total) by mouth daily.   amoxicillin  (AMOXIL ) 500 MG capsule Take 4 capsule (2000 mg) 30 min prior to dental procedure (Patient not taking: Reported on 08/02/2023)   No facility-administered encounter medications on file as of 08/02/2023.    Allergies (verified) Patient has no known allergies.   History: Past  Medical History:  Diagnosis Date   Atrial fibrillation (HCC)    Post-op. No recurrence.    Depression    Hypercholesteremia 04/21/2011   Irregularly irregular cardiac rhythm 12/29/2020   Mitral regurgitation    Myxomatous degeneration of mitral valve    Tobacco abuse    Past Surgical History:  Procedure Laterality Date   KNEE ARTHROSCOPY Left 06/2015   MITRAL VALVE REPAIR  06/14/2010   right minithoracotomy for  complex valvuloplasty with 30mm ring annuloplasty - Dr. Dusty   SHOULDER SURGERY     TONSILLECTOMY     Family History  Problem Relation Age of Onset   Heart failure Mother    Dementia Mother    Cancer Father        Lung   COPD Father    Heart disease Brother    Heart disease Brother    Colon cancer Neg Hx    Esophageal cancer Neg Hx    Stomach cancer Neg Hx    Rectal cancer Neg Hx    Social History   Socioeconomic History   Marital status: Married    Spouse name: Not on file   Number of children: 2   Years of education: Not on file   Highest education level: Bachelor's degree (e.g., BA, AB, BS)  Occupational History   Occupation: Retired  Tobacco Use   Smoking status: Former    Current packs/day: 0.00    Average packs/day: 1 pack/day for 8.0 years (8.0 ttl pk-yrs)    Types: Cigarettes    Start date: 03/14/1969    Quit date: 03/14/1977    Years since quitting: 46.4   Smokeless tobacco: Never  Vaping Use   Vaping status: Never Used  Substance and Sexual Activity   Alcohol use: Yes    Comment: weekly   Drug use: No   Sexual activity: Yes  Other Topics Concern   Not on file  Social History Narrative   Current Social History 12/20/2016        Patient lives with Wife in one level home 12/20/2016   Transportation: Patient has own vehicle and drives himself 12/20/2016   Important Relationships Wife, sons and brothers 12/20/2016    Pets: Lab/boxer mix Systems developer) and Bichon Mervin) 12/20/2016   Education / Work:  BS/ Works at [employer]; standing 8-9 hours per day12/05/2016   Interests / Fun: Being active, art, music (singing), hiking (when not in pain from left foot bunion) 12/20/2016   Current Stressors: Finances 12/20/2016   Religious / Personal Beliefs: I believe in God. 12/20/2016      L. Jori, RN, BSN       Primary caregiver for wife who is non ambulatory                                                                                                 Social Drivers  of Health   Financial Resource Strain: Low Risk  (08/02/2023)   Overall Financial Resource Strain (CARDIA)    Difficulty of Paying Living Expenses: Not hard at all  Food Insecurity: No Food Insecurity (08/02/2023)   Hunger  Vital Sign    Worried About Programme researcher, broadcasting/film/video in the Last Year: Never true    Ran Out of Food in the Last Year: Never true  Transportation Needs: No Transportation Needs (08/02/2023)   PRAPARE - Administrator, Civil Service (Medical): No    Lack of Transportation (Non-Medical): No  Physical Activity: Sufficiently Active (08/02/2023)   Exercise Vital Sign    Days of Exercise per Week: 7 days    Minutes of Exercise per Session: 60 min  Stress: No Stress Concern Present (08/02/2023)   [employer] of Occupational Health - Occupational Stress Questionnaire    Feeling of Stress: Only a little  Social Connections: Moderately Isolated (08/02/2023)   Social Connection and Isolation Panel    Frequency of Communication with Friends and Family: More than three times a week    Frequency of Social Gatherings with Friends and Family: Three times a week    Attends Religious Services: Never    Active Member of Clubs or Organizations: No    Attends Engineer, structural: Never    Marital Status: Married    Tobacco Counseling Counseling given: Not Answered    Clinical Intake:  Pre-visit preparation completed: Yes  Pain : No/denies pain Pain Score: 0-No pain     BMI - recorded: 26.91 Nutritional Status: BMI 25 -29 Overweight Nutritional Risks: None Diabetes: No  Lab Results  Component Value Date   HGBA1C 5.4 08/11/2019   HGBA1C  06/09/2010    5.4 (NOTE)                                                                       According to the ADA Clinical Practice Recommendations for 2011, when HbA1c is used as a screening test:   >=6.5%   Diagnostic of Diabetes Mellitus           (if abnormal result  is confirmed)  5.7-6.4%   Increased risk of  developing Diabetes Mellitus  References:Diagnosis and Classification of Diabetes Mellitus,Diabetes Care,2011,34(Suppl 1):S62-S69 and Standards of Medical Care in         Diabetes - 2011,Diabetes Care,2011,34  (Suppl 1):S11-S61.     How often do you need to have someone help you when you read instructions, pamphlets, or other written materials from your doctor or pharmacy?: 1 - Never  Interpreter Needed?: No  Information entered by :: Pearlie Lafosse N. Wray Goehring, LPN.   Activities of Daily Living     08/02/2023    1:45 PM  In your present state of health, do you have any difficulty performing the following activities:  Hearing? 0  Vision? 0  Difficulty concentrating or making decisions? 0  Walking or climbing stairs? 0  Dressing or bathing? 0  Doing errands, shopping? 0  Preparing Food and eating ? N  Using the Toilet? N  In the past six months, have you accidently leaked urine? N  Do you have problems with loss of bowel control? N  Managing your Medications? N  Managing your Finances? N  Housekeeping or managing your Housekeeping? N    Patient Care Team: Madelon Donald HERO, DO as PCP - General (Family Medicine) Verlin Lonni BIRCH, MD as PCP - Cardiology (Cardiology) Dorlene Dallas NOVAK., MD  as Referring Physician (Sports Medicine) Verlin Lonni BIRCH, MD as Consulting Physician (Cardiology)  I have updated your Care Teams any recent Medical Services you may have received from other providers in the past year.     Assessment:   This is a routine wellness examination for Kase.  Hearing/Vision screen Hearing Screening - Comments:: Denies hearing difficulties.  Vision Screening - Comments:: Wears rx glasses - not up to date with routine eye exams.    Goals Addressed             This Visit's Progress    08/02/23: Goals no, but dreams yes.  I am dreaming to jog again and I want to stay challenged so that I can take care of my wife.         Depression Screen      08/02/2023    1:41 PM 08/21/2022   11:03 AM 07/24/2022    1:57 PM 12/29/2019   11:34 AM 12/22/2019    9:56 AM 12/08/2019   11:21 AM 09/10/2019    1:40 PM  PHQ 2/9 Scores  PHQ - 2 Score 0 0 0 0 0 0 0  PHQ- 9 Score 0   0 0 2 1    Fall Risk     08/02/2023    1:41 PM 08/21/2022   11:03 AM 12/29/2020    1:50 PM 12/22/2019    9:56 AM 08/15/2018    6:02 PM  Fall Risk   Falls in the past year? 0 0 0 0 0   Comment     Emmi Telephone Survey: data to providers prior to load   Number falls in past yr: 0 0  0   Injury with Fall? 0 0     Risk for fall due to : No Fall Risks No Fall Risks     Follow up Falls evaluation completed   Falls evaluation completed       Data saved with a previous flowsheet row definition    MEDICARE RISK AT HOME:  Medicare Risk at Home Any stairs in or around the home?: Yes (Basement, Two bedrooms and a bath) If so, are there any without handrails?: No Home free of loose throw rugs in walkways, pet beds, electrical cords, etc?: Yes Adequate lighting in your home to reduce risk of falls?: Yes Life alert?: No Use of a cane, walker or w/c?: No Grab bars in the bathroom?: Yes Shower chair or bench in shower?: Yes Elevated toilet seat or a handicapped toilet?: Yes  TIMED UP AND GO:  Was the test performed?  No  Cognitive Function: Declined/Normal: No cognitive concerns noted by patient or family. Patient alert, oriented, able to answer questions appropriately and recall recent events. No signs of memory loss or confusion.    08/02/2023    1:45 PM  MMSE - Mini Mental State Exam  Not completed: Unable to complete        08/02/2023    1:45 PM  6CIT Screen  What Year? 0 points  What month? 0 points  What time? 0 points  Count back from 20 0 points  Months in reverse 0 points  Repeat phrase 0 points  Total Score 0 points    Immunizations Immunization History  Administered Date(s) Administered   Influenza, High Dose Seasonal PF 03/04/2016, 11/15/2016,  11/21/2017, 01/08/2020   Influenza,inj,Quad PF,6+ Mos 10/02/2012, 09/16/2014   Influenza-Unspecified 11/16/2020   PFIZER(Purple Top)SARS-COV-2 Vaccination 02/21/2019, 03/14/2019, 01/08/2020   Pneumococcal Conjugate-13 10/02/2012   Pneumococcal Polysaccharide-23  09/16/2014   Tdap 04/21/2011   Zoster Recombinant(Shingrix) 11/26/2016, 03/26/2017    Screening Tests Health Maintenance  Topic Date Due   DTaP/Tdap/Td (2 - Td or Tdap) 04/20/2021   COVID-19 Vaccine (4 - 2024-25 season) 09/17/2022   INFLUENZA VACCINE  08/17/2023   Colonoscopy  04/25/2024   Medicare Annual Wellness (AWV)  08/01/2024   Pneumococcal Vaccine: 50+ Years  Completed   Hepatitis C Screening  Completed   Zoster Vaccines- Shingrix  Completed   Hepatitis B Vaccines  Aged Out   HPV VACCINES  Aged Out   Meningococcal B Vaccine  Aged Out    Health Maintenance  Health Maintenance Due  Topic Date Due   DTaP/Tdap/Td (2 - Td or Tdap) 04/20/2021   COVID-19 Vaccine (4 - 2024-25 season) 09/17/2022   Health Maintenance Items Addressed: Yes Patient aware of current care gaps.  Immunization record was verified by [facility] and updated in patient's chart. Patient declined Covid vaccine and will schedule for Dtap at his local pharmacy.  Additional Screening:  Vision Screening: Recommended annual ophthalmology exams for early detection of glaucoma and other disorders of the eye. Would you like a referral to an eye doctor? No    Dental Screening: Recommended annual dental exams for proper oral hygiene  Community Resource Referral / Chronic Care Management: CRR required this visit?  No   CCM required this visit?  No   Plan:    I have personally reviewed and noted the following in the patient's chart:   Medical and social history Use of alcohol, tobacco or illicit drugs  Current medications and supplements including opioid prescriptions. Patient is not currently taking opioid prescriptions. Functional ability and  status Nutritional status Physical activity Advanced directives List of other physicians Hospitalizations, surgeries, and ER visits in previous 12 months Vitals Screenings to include cognitive, depression, and falls Referrals and appointments  In addition, I have reviewed and discussed with patient certain preventive protocols, quality metrics, and best practice recommendations. A written personalized care plan for preventive services as well as general preventive health recommendations were provided to patient.   Roz LOISE Fuller, LPN   2/82/7974   After Visit Summary: (MyChart) Due to this being a telephonic visit, the after visit summary with patients personalized plan was offered to patient via MyChart   Notes: Patient aware of current care gaps.  Immunization record was verified by [facility] and updated in patient's chart. Patient declined Covid vaccine and will schedule for Dtap at his local pharmacy.

## 2023-08-02 NOTE — Telephone Encounter (Signed)
 During AWV today, patient stated that he would like to discontinue Rosuvastatin  due to muscle aches.  He has started taking Creatinine for the last month and that has decreased the muscle pain.  Patient would like to start another medication for his cholesterol.  Please advise.  CB# [phone]   Cydnie Deason N. Tomie, LPN [facility] Annual Wellness Team Direct Dial: [phone]

## 2023-08-03 ENCOUNTER — Telehealth: Payer: Self-pay | Admitting: Family Medicine

## 2023-08-03 NOTE — Telephone Encounter (Signed)
 Called patient to schedule appointment, LVM to call back.   Appointment Notes: Discuss medication/Check up.   Thanks!

## 2023-08-16 NOTE — Progress Notes (Unsigned)
   SUBJECTIVE:   CHIEF COMPLAINT / HPI:   MV repair, PAF: - h/o severe MP s/p repair 2012. H/o post op afib on coumadin , amiodarone.  - follows with Cardiology - Medications: eliquis  - Compliance: good - Denies any SOB, CP, medication Ses  Precancerous tubular colonic adenoma - colonoscopy 04/2023. Due for repeat colonoscopy 04/2024.  - per GI, given number of polyps and precancerous, consider genetics referral given number of polyps removed.  HLD - medications: rosuvastatin  - compliance: stopped taking 1 month ago. - medication SEs: muscle aches (got better with creatine), brain fog (got better since stopping taking)   OBJECTIVE:   BP 101/71   Pulse 81   Ht 6' 1 (1.854 m)   Wt 201 lb (91.2 kg)   SpO2 98%   BMI 26.52 kg/m   Gen: well appearing, in NAD Card: irregularly irregular rhythm, regular rate Lungs: CTAB Ext: WWP, no edema   ASSESSMENT/PLAN:   Permanent atrial fibrillation (HCC) In Afib today, rate normal. Compliant with eliquis  without bleeding, continue.  Hyperlipidemia After discussion, will trial eod dosing or 1/2 pill daily. Will let me know if symptoms return. Lab closed today, return to clinic for lab visit for lipid panel.   Colonic adenoma With multiple polyps. Pt will discuss with son whether or not to pursue genetics evaluation and will let me know. Recommended f/u colonoscopy next year.   HM - due for Tdap.   Kenneth CHRISTELLA Lai, DO

## 2023-08-17 ENCOUNTER — Ambulatory Visit: Admitting: Family Medicine

## 2023-08-17 ENCOUNTER — Encounter: Payer: Self-pay | Admitting: Family Medicine

## 2023-08-17 VITALS — BP 101/71 | HR 81 | Ht 73.0 in | Wt 201.0 lb

## 2023-08-17 DIAGNOSIS — I4821 Permanent atrial fibrillation: Secondary | ICD-10-CM

## 2023-08-17 DIAGNOSIS — D126 Benign neoplasm of colon, unspecified: Secondary | ICD-10-CM

## 2023-08-17 DIAGNOSIS — E785 Hyperlipidemia, unspecified: Secondary | ICD-10-CM | POA: Diagnosis not present

## 2023-08-17 NOTE — Assessment & Plan Note (Signed)
 After discussion, will trial eod dosing or 1/2 pill daily. Will let me know if symptoms return. Lab closed today, return to clinic for lab visit for lipid panel.

## 2023-08-17 NOTE — Assessment & Plan Note (Signed)
 With multiple polyps. Pt will discuss with son whether or not to pursue genetics evaluation and will let me know. Recommended f/u colonoscopy next year.

## 2023-08-17 NOTE — Patient Instructions (Addendum)
 It was great to see you!  Our plans for today:  - Talk to Chyrl about whether or not you want to get genetics counseling given your recent colonoscopy results. I do recommend getting your follow up colonoscopy next year.  - Consider taking a half pill of your rosuvastatin  or take a pill every other day and let me know if you have issues. - Come back just for a lab visit at your convenience to recheck your cholesterol.  - Consider updating your Tetanus vaccine at your pharmacy.   Take care and seek immediate care sooner if you develop any concerns.   Dr. Jennell Janosik

## 2023-08-17 NOTE — Assessment & Plan Note (Addendum)
 In Afib today, rate normal. Compliant with eliquis  without bleeding, continue.

## 2023-08-23 ENCOUNTER — Other Ambulatory Visit: Payer: Self-pay | Admitting: Cardiovascular Disease

## 2023-08-23 DIAGNOSIS — I4821 Permanent atrial fibrillation: Secondary | ICD-10-CM

## 2023-08-23 DIAGNOSIS — Z9889 Other specified postprocedural states: Secondary | ICD-10-CM

## 2023-08-23 NOTE — Telephone Encounter (Signed)
 Prescription refill request for Eliquis  received. Indication: Afib  Last office visit: 01/04/23 Kenneth Mclaughlin)  Scr: 1.11 (07/31/23)  Age: 78 Weight: 91.2kg  Appropriate dose. Refill sent.

## 2023-11-13 ENCOUNTER — Encounter: Payer: Self-pay | Admitting: Family Medicine

## 2023-11-13 ENCOUNTER — Ambulatory Visit (INDEPENDENT_AMBULATORY_CARE_PROVIDER_SITE_OTHER): Admitting: Family Medicine

## 2023-11-13 VITALS — BP 132/88 | HR 92 | Ht 73.0 in | Wt 210.5 lb

## 2023-11-13 DIAGNOSIS — R2689 Other abnormalities of gait and mobility: Secondary | ICD-10-CM | POA: Diagnosis not present

## 2023-11-13 DIAGNOSIS — G44201 Tension-type headache, unspecified, intractable: Secondary | ICD-10-CM | POA: Diagnosis not present

## 2023-11-13 NOTE — Patient Instructions (Addendum)
 If any of your results from today are abnormal and/or require changes to your medical care, I will give you a call. Otherwise, I will send you a letter in the mail or a message on MyChart.   You should get a call soon to schedule your MRI  Please seek urgent medical attention if you develop any numbness or weakness in your arms or legs, changes to your vision, severe worsening of your headache, falls, chest pain, or shortness of breath

## 2023-11-13 NOTE — Progress Notes (Signed)
    SUBJECTIVE:   CHIEF COMPLAINT / HPI:   Discussed the use of AI scribe software for clinical note transcription with the patient, who gave verbal consent to proceed.  History of Present Illness Kenneth Mclaughlin is a 78 year old male with atrial fibrillation who presents with new onset headaches and balance issues.  Headache - Onset last Thursday - Described as soreness similar to after a loud concert - Located at the top and front of the head - Lasts several hours at a time - No medication taken for headache  Gait instability and disequilibrium - Sensation of being off balance since last Thursday - Described as feeling like being on a shifting ship - Worse over the weekend, now improved a bit - No falls, recent head injuries, or car accidents - No changes in vision, numbness, or weakness - No chest pain, shortness of breath, palpitations, nausea, or vomiting during episodes  Atrial fibrillation and anticoagulation - Takes Eliquis  for atrial fibrillation - Monitors heart rate at home - History of mitral valve surgery 14 years ago  Denies smoking, significant alcohol use, drug use.  Denies concern for sexually transmitted infection     PERTINENT  PMH / PSH: A-fib on Eliquis , history of mitral valve repair  OBJECTIVE:   BP 132/88   Pulse 92   Ht 6' 1 (1.854 m)   Wt 210 lb 8 oz (95.5 kg)   SpO2 99%   BMI 27.77 kg/m    General: NAD, pleasant, able to participate in exam Cardiac: Normal rate, irregularly irregular rhythm, no murmurs auscultated Respiratory: CTAB, normal WOB Abdomen: soft, non-tender, non-distended, normoactive bowel sounds Extremities: warm and well perfused, no edema or cyanosis Skin: warm and dry, no rashes noted Neuro: alert, no obvious focal deficits, speech normal.  Visual field grossly intact.  Cranial nerves II through XII intact without any focal deficits.  Finger-nose testing normal.  Heel-to-shin normal.  Romberg normal.  No  asterixis. Psych: Normal affect and mood  ASSESSMENT/PLAN:    Assessment & Plan Intractable tension-type headache, unspecified chronicity pattern Balance problem New onset headaches, high risk due to age and anticoagulation Obtain MRI brain Reassuringly no neurologic deficits on my exam today, no recent history of trauma, low suspicion for acute TIA or CVA Given associated balance concern, also check CBC, BMP, B12.  Also placed referral to physical therapy Recommend trial OTC Tylenol  to see if this helps with headache   Payton Coward, MD [facility]

## 2023-11-14 ENCOUNTER — Ambulatory Visit: Payer: Self-pay | Admitting: Family Medicine

## 2023-11-14 LAB — BASIC METABOLIC PANEL WITH GFR
BUN/Creatinine Ratio: 14 (ref 10–24)
BUN: 14 mg/dL (ref 8–27)
CO2: 26 mmol/L (ref 20–29)
Calcium: 8.9 mg/dL (ref 8.6–10.2)
Chloride: 98 mmol/L (ref 96–106)
Creatinine, Ser: 0.97 mg/dL (ref 0.76–1.27)
Glucose: 79 mg/dL (ref 70–99)
Potassium: 4.4 mmol/L (ref 3.5–5.2)
Sodium: 138 mmol/L (ref 134–144)
eGFR: 81 mL/min/1.73 (ref >59)

## 2023-11-14 LAB — CBC WITH DIFFERENTIAL/PLATELET
Basophils Absolute: 0 x10E3/uL (ref 0.0–0.2)
Basos: 1 %
EOS (ABSOLUTE): 0.1 x10E3/uL (ref 0.0–0.4)
Eos: 1 %
Hematocrit: 42.7 % (ref 37.5–51.0)
Hemoglobin: 14.9 g/dL (ref 13.0–17.7)
Immature Grans (Abs): 0 x10E3/uL (ref 0.0–0.1)
Immature Granulocytes: 0 %
Lymphocytes Absolute: 1.4 x10E3/uL (ref 0.7–3.1)
Lymphs: 25 %
MCH: 33.4 pg — ABNORMAL HIGH (ref 26.6–33.0)
MCHC: 34.9 g/dL (ref 31.5–35.7)
MCV: 96 fL (ref 79–97)
Monocytes Absolute: 0.6 x10E3/uL (ref 0.1–0.9)
Monocytes: 10 %
Neutrophils Absolute: 3.5 x10E3/uL (ref 1.4–7.0)
Neutrophils: 63 %
Platelets: 163 x10E3/uL (ref 150–450)
RBC: 4.46 x10E6/uL (ref 4.14–5.80)
RDW: 11.9 % (ref 11.6–15.4)
WBC: 5.6 x10E3/uL (ref 3.4–10.8)

## 2023-11-14 LAB — VITAMIN B12: Vitamin B-12: 396 pg/mL (ref 232–1245)

## 2023-11-22 ENCOUNTER — Encounter (HOSPITAL_COMMUNITY): Payer: Self-pay

## 2023-11-22 ENCOUNTER — Ambulatory Visit (HOSPITAL_COMMUNITY)
Admission: RE | Admit: 2023-11-22 | Discharge: 2023-11-22 | Disposition: A | Discharge status: Home / Self Care | Source: Ambulatory Visit | Attending: Family Medicine | Admitting: Family Medicine

## 2023-11-22 DIAGNOSIS — R2689 Other abnormalities of gait and mobility: Secondary | ICD-10-CM | POA: Insufficient documentation

## 2023-11-22 DIAGNOSIS — G44201 Tension-type headache, unspecified, intractable: Secondary | ICD-10-CM | POA: Diagnosis present

## 2023-11-22 MED ORDER — GADOBUTROL 1 MMOL/ML IV SOLN
9.5000 mL | Freq: Once | INTRAVENOUS | Status: AC | PRN
Start: 2023-11-22 — End: 2023-11-22
  Administered 2023-11-22: 9.5 mL via INTRAVENOUS

## 2023-12-03 ENCOUNTER — Telehealth: Payer: Self-pay

## 2023-12-03 NOTE — Telephone Encounter (Signed)
-----   Message from [facility] Dayshia O sent at 11/14/2023  8:18 AM EDT ----- Ok to schedule CT at [facility] facility.  Cassell Mary CMA

## 2023-12-03 NOTE — Telephone Encounter (Signed)
 Hi Dr. Madelon does this patient still need to have the CT done since they just had an MRI done on 11/10? Nelson Land, CMA

## 2023-12-04 ENCOUNTER — Other Ambulatory Visit: Payer: Self-pay

## 2023-12-04 ENCOUNTER — Ambulatory Visit: Attending: Family Medicine

## 2023-12-04 DIAGNOSIS — R2689 Other abnormalities of gait and mobility: Secondary | ICD-10-CM | POA: Diagnosis not present

## 2023-12-04 DIAGNOSIS — R262 Difficulty in walking, not elsewhere classified: Secondary | ICD-10-CM | POA: Insufficient documentation

## 2023-12-04 NOTE — Therapy (Signed)
 OUTPATIENT PHYSICAL THERAPY NEURO EVALUATION   Patient Name: Kenneth Mclaughlin MRN: [id number] DOB:1945/04/08, 78 y.o., male Today's Date: 12/04/2023   PCP: Donald Lai, DO REFERRING PROVIDER: same  END OF SESSION:  PT End of Session - 12/04/23 1447     Visit Number 1    Date for Recertification  02/26/24    Progress Note Due on Visit 10    PT Start Time 1445    PT Stop Time 1530    PT Time Calculation (min) 45 min    Activity Tolerance Patient tolerated treatment well    Behavior During Therapy [facility] for tasks assessed/performed          Past Medical History:  Diagnosis Date   Atrial fibrillation (HCC)    Post-op. No recurrence.    Depression    Hypercholesteremia 04/21/2011   Irregularly irregular cardiac rhythm 12/29/2020   Mitral regurgitation    Myxomatous degeneration of mitral valve    Tobacco abuse    Past Surgical History:  Procedure Laterality Date   KNEE ARTHROSCOPY Left 06/2015   MITRAL VALVE REPAIR  06/14/2010   right minithoracotomy for complex valvuloplasty with 30mm ring annuloplasty - Dr. Dusty ROYS SURGERY     TONSILLECTOMY     Patient Active Problem List   Diagnosis Date Noted   Colonic adenoma 08/17/2023   Positive colorectal cancer screening using Cologuard test 08/14/2022   Actinic keratosis 12/09/2019   Neoplasm of uncertain behavior of skin of chest 12/08/2019   Low back pain 10/02/2012   Hyperlipidemia 04/21/2011   S/P mitral valve repair 11/07/2010   Myxomatous degeneration of mitral valve 04/08/2010   Need for SBE (subacute bacterial endocarditis) prophylaxis 04/08/2010   Permanent atrial fibrillation (HCC) 03/14/2010    ONSET DATE: 11/12/23  REFERRING DIAG: balance deficits  THERAPY DIAG:  Difficulty in walking, not elsewhere classified  Rationale for Evaluation and Treatment: Rehabilitation  SUBJECTIVE:                                                                                                                                                                                              SUBJECTIVE STATEMENT: Reports that his headache and the imbalance that was occurring when he saw his PCP 3 weeks ago has resolved, now resolved for about one week, wasn't sure if he needed to come to today's appt.. Pt accompanied by: self  PERTINENT HISTORY: sudden onset of headache and balance loss 10/23/ 25, saw PCP, cleared for any neurologic deficit, had MRI which was normal.  Was referred to PT for eval  PAIN:  Are you having pain? No  PRECAUTIONS: None  RED FLAGS: None   WEIGHT BEARING RESTRICTIONS: No  FALLS: Has patient fallen in last 6 months? No  LIVING ENVIRONMENT: Lives with: lives with their spouse Lives in: House/apartment Stairs: No Has following equipment at home: caregiver for his wife, assists her into hoyer lift and into recliner, in and out of bed every day  PLOF: Independent  PATIENT GOALS: no more balance issues  OBJECTIVE:  Note: Objective measures were completed at Evaluation unless otherwise noted.  DIAGNOSTIC FINDINGS:MRI brain: IMPRESSION: No significant intracranial finding. No abnormality seen to explain the clinical presentation. Incidental arachnoid cyst at the anterior middle cranial fossa on the left, virtually never symptomatic or clinically relevant.     Electronically Signed   By: Oneil Officer M.D.   On: 11/26/2023 15:20  COGNITION: Overall cognitive status: Within functional limits for tasks assessed   SENSATION: WFL  COORDINATION: intact  EDEMA:  none   POSTURE: mild rounding shoulders, lean healthy appearance  LOWER EXTREMITY ROM:   wfl  LOWER EXTREMITY MMT:  wfl FUNCTIONAL TESTS:  DGI 24/24 Single leg stance over 20 sec each leg  PATIENT SURVEYS:  DHI no deficit                                                                                                                              TREATMENT DATE: 12/04/23 Screened for vestibular  deficits with DGI, single leg stance, pt scored perfect scores    PATIENT EDUCATION: Education details: eval findings Person educated: Patient Education method: Explanation Education comprehension: verbalized understanding  HOME EXERCISE PROGRAM: na  GOALS: Goals reviewed with patient? na  ASSESSMENT:  CLINICAL IMPRESSION: Patient is a 78 y.o. male who was evaluated today by physical therapy due to sudden onset headache and vestibular/balance deficits which lasted approximately 2 1/2 weeks.   Symptoms have been resolved for about a week now.  He was able to perform high level balance tests which are designed to provoke the vestibular system without any problems, perfect scores .   No further PT recommended.   OBJECTIVE IMPAIRMENTS: none noted.   ACTIVITY LIMITATIONS: none noted  PARTICIPATION LIMITATIONS: none noted  PERSONAL FACTORS: Age and Social background are also affecting patient's functional outcome.   REHAB POTENTIAL: Good  CLINICAL DECISION MAKING: Stable/uncomplicated  EVALUATION COMPLEXITY: Low  PLAN:  PT FREQUENCY: one time visit  PT DURATION: 1 sessions  PLANNED INTERVENTIONS: na  PLAN FOR NEXT SESSION: evaluation only   Alexie Lanni L Klara Stjames, PT, DPT, OCS 12/04/2023, 3:29 PM

## 2023-12-27 ENCOUNTER — Other Ambulatory Visit: Payer: Self-pay | Admitting: Family Medicine

## 2023-12-27 DIAGNOSIS — E785 Hyperlipidemia, unspecified: Secondary | ICD-10-CM

## 2024-01-01 ENCOUNTER — Ambulatory Visit (HOSPITAL_COMMUNITY): Payer: PPO

## 2024-01-01 NOTE — Addendum Note (Signed)
 Addended by: CONNY KIEF T on: 01/01/2024 02:02 PM   Modules accepted: Orders

## 2024-01-08 ENCOUNTER — Ambulatory Visit: Attending: Physician Assistant

## 2024-01-08 DIAGNOSIS — Z9889 Other specified postprocedural states: Secondary | ICD-10-CM | POA: Diagnosis not present

## 2024-01-08 DIAGNOSIS — Z954 Presence of other heart-valve replacement: Secondary | ICD-10-CM

## 2024-01-08 LAB — ECHOCARDIOGRAM COMPLETE
AR max vel: 3.6 cm2
AV Area VTI: 3.47 cm2
AV Area mean vel: 3.36 cm2
AV Mean grad: 2.3 mmHg
AV Peak grad: 4.5 mmHg
Ao pk vel: 1.06 m/s
Area-P 1/2: 9.57 cm2
MV M vel: 2.07 m/s
MV Peak grad: 17.1 mmHg
MV VTI: 1.63 cm2
P 1/2 time: 330 ms
S' Lateral: 4 cm

## 2024-01-14 ENCOUNTER — Ambulatory Visit: Payer: Self-pay | Admitting: Physician Assistant

## 2024-08-04 ENCOUNTER — Encounter
